# Patient Record
Sex: Female | Born: 1995 | Race: White | Hispanic: No | Marital: Married | Smoking: Current every day smoker
Health system: Southern US, Community
[De-identification: ages and names within clinical notes are randomized; demographics above are authoritative.]

## PROBLEM LIST (undated history)

## (undated) ENCOUNTER — Emergency Department (HOSPITAL_COMMUNITY): Payer: Medicaid Other

## (undated) DIAGNOSIS — F32A Depression, unspecified: Secondary | ICD-10-CM

## (undated) DIAGNOSIS — F419 Anxiety disorder, unspecified: Secondary | ICD-10-CM

## (undated) HISTORY — PX: SPINE SURGERY: SHX786

## (undated) HISTORY — PX: BACK SURGERY: SHX140

## (undated) HISTORY — PX: JOINT REPLACEMENT: SHX530

---

## 2014-04-23 DIAGNOSIS — S069XAA Unspecified intracranial injury with loss of consciousness status unknown, initial encounter: Secondary | ICD-10-CM | POA: Insufficient documentation

## 2014-04-23 DIAGNOSIS — Z87820 Personal history of traumatic brain injury: Secondary | ICD-10-CM | POA: Insufficient documentation

## 2014-04-23 DIAGNOSIS — S069X9A Unspecified intracranial injury with loss of consciousness of unspecified duration, initial encounter: Secondary | ICD-10-CM | POA: Insufficient documentation

## 2020-08-03 ENCOUNTER — Encounter (HOSPITAL_COMMUNITY): Payer: Self-pay | Admitting: Emergency Medicine

## 2020-08-03 ENCOUNTER — Inpatient Hospital Stay (HOSPITAL_COMMUNITY)
Admission: EM | Admit: 2020-08-03 | Discharge: 2020-08-04 | Disposition: A | Discharge status: Home / Self Care | Payer: Medicaid Other | Attending: Obstetrics and Gynecology | Admitting: Obstetrics and Gynecology

## 2020-08-03 ENCOUNTER — Other Ambulatory Visit: Payer: Self-pay

## 2020-08-03 DIAGNOSIS — Z3A01 Less than 8 weeks gestation of pregnancy: Secondary | ICD-10-CM | POA: Insufficient documentation

## 2020-08-03 DIAGNOSIS — F17210 Nicotine dependence, cigarettes, uncomplicated: Secondary | ICD-10-CM | POA: Diagnosis not present

## 2020-08-03 DIAGNOSIS — O209 Hemorrhage in early pregnancy, unspecified: Secondary | ICD-10-CM | POA: Insufficient documentation

## 2020-08-03 DIAGNOSIS — O99331 Smoking (tobacco) complicating pregnancy, first trimester: Secondary | ICD-10-CM | POA: Insufficient documentation

## 2020-08-03 DIAGNOSIS — Z8759 Personal history of other complications of pregnancy, childbirth and the puerperium: Secondary | ICD-10-CM

## 2020-08-03 DIAGNOSIS — O3680X0 Pregnancy with inconclusive fetal viability, not applicable or unspecified: Secondary | ICD-10-CM

## 2020-08-03 HISTORY — DX: Depression, unspecified: F32.A

## 2020-08-03 HISTORY — DX: Anxiety disorder, unspecified: F41.9

## 2020-08-03 NOTE — ED Provider Notes (Signed)
MSE was initiated and I personally evaluated the patient and placed orders (if any) at  11:39 PM on August 03, 2020.  Due for menses last Sunday (LMP 3/9). It didn't start. Had intercourse that night and found blood afterward, without starting a period. Has done 2 home preg tests that are positive. Having light abdominal cramping. No dysuria but feels she is urinating more often. Nausea x 2 weeks. G3, uncomplicated pregnancies.   Today's Vitals   08/03/20 2250  BP: 108/71  Pulse: 90  Resp: 20  Temp: 98.2 F (36.8 C)  TempSrc: Oral  SpO2: 98%   There is no height or weight on file to calculate BMI.  Abdomen nontender  I-stat HCG positive. Discussed with MAU APP, Lelan Pons, who accepts the patient in transfer.   The patient appears stable so that the remainder of the MSE may be completed by another provider.   Charlann Lange, PA-C 08/04/20 0012    Ezequiel Essex, MD 08/04/20 [phone]

## 2020-08-03 NOTE — ED Triage Notes (Signed)
Patient reports vaginal bleeding after sexual encounter with husband this week with mild hypogastric cramping .

## 2020-08-04 ENCOUNTER — Encounter (HOSPITAL_COMMUNITY): Payer: Self-pay

## 2020-08-04 ENCOUNTER — Inpatient Hospital Stay (HOSPITAL_COMMUNITY): Payer: Medicaid Other

## 2020-08-04 DIAGNOSIS — Z8759 Personal history of other complications of pregnancy, childbirth and the puerperium: Secondary | ICD-10-CM

## 2020-08-04 DIAGNOSIS — O209 Hemorrhage in early pregnancy, unspecified: Secondary | ICD-10-CM

## 2020-08-04 DIAGNOSIS — Z3A01 Less than 8 weeks gestation of pregnancy: Secondary | ICD-10-CM

## 2020-08-04 DIAGNOSIS — R519 Headache, unspecified: Secondary | ICD-10-CM | POA: Insufficient documentation

## 2020-08-04 HISTORY — DX: Personal history of other complications of pregnancy, childbirth and the puerperium: Z87.59

## 2020-08-04 LAB — CBC WITH DIFFERENTIAL/PLATELET
Abs Immature Granulocytes: 0.02 10*3/uL (ref 0.00–0.07)
Basophils Absolute: 0 10*3/uL (ref 0.0–0.1)
Basophils Relative: 0 %
Eosinophils Absolute: 0.2 10*3/uL (ref 0.0–0.5)
Eosinophils Relative: 2 %
HCT: 41.6 % (ref 36.0–46.0)
Hemoglobin: 14 g/dL (ref 12.0–15.0)
Immature Granulocytes: 0 %
Lymphocytes Relative: 41 %
Lymphs Abs: 3.5 10*3/uL (ref 0.7–4.0)
MCH: 30.2 pg (ref 26.0–34.0)
MCHC: 33.7 g/dL (ref 30.0–36.0)
MCV: 89.7 fL (ref 80.0–100.0)
Monocytes Absolute: 0.5 10*3/uL (ref 0.1–1.0)
Monocytes Relative: 6 %
Neutro Abs: 4.3 10*3/uL (ref 1.7–7.7)
Neutrophils Relative %: 51 %
Platelets: 289 10*3/uL (ref 150–400)
RBC: 4.64 MIL/uL (ref 3.87–5.11)
RDW: 12.3 % (ref 11.5–15.5)
WBC: 8.5 10*3/uL (ref 4.0–10.5)
nRBC: 0 % (ref 0.0–0.2)

## 2020-08-04 LAB — I-STAT BETA HCG BLOOD, ED (MC, WL, AP ONLY): I-stat hCG, quantitative: 114.7 m[IU]/mL — ABNORMAL HIGH (ref <5)

## 2020-08-04 LAB — WET PREP, GENITAL
Clue Cells Wet Prep HPF POC: NONE SEEN
Sperm: NONE SEEN
Trich, Wet Prep: NONE SEEN
Yeast Wet Prep HPF POC: NONE SEEN

## 2020-08-04 LAB — BASIC METABOLIC PANEL
Anion gap: 5 (ref 5–15)
BUN: 9 mg/dL (ref 6–20)
CO2: 28 mmol/L (ref 22–32)
Calcium: 9.6 mg/dL (ref 8.9–10.3)
Chloride: 103 mmol/L (ref 98–111)
Creatinine, Ser: 0.72 mg/dL (ref 0.44–1.00)
GFR, Estimated: >60 mL/min (ref >60)
Glucose, Bld: 85 mg/dL (ref 70–99)
Potassium: 4.3 mmol/L (ref 3.5–5.1)
Sodium: 136 mmol/L (ref 135–145)

## 2020-08-04 LAB — GC/CHLAMYDIA PROBE AMP (~~LOC~~) NOT AT ARMC
Chlamydia: NEGATIVE
Comment: NEGATIVE
Comment: NORMAL
Neisseria Gonorrhea: NEGATIVE

## 2020-08-04 LAB — URINALYSIS, ROUTINE W REFLEX MICROSCOPIC
Bilirubin Urine: NEGATIVE
Glucose, UA: NEGATIVE mg/dL
Hgb urine dipstick: NEGATIVE
Ketones, ur: NEGATIVE mg/dL
Leukocytes,Ua: NEGATIVE
Nitrite: NEGATIVE
Protein, ur: NEGATIVE mg/dL
Specific Gravity, Urine: 1.013 (ref 1.005–1.030)
pH: 7 (ref 5.0–8.0)

## 2020-08-04 NOTE — Discharge Instructions (Signed)
Vaginal Bleeding During Pregnancy, First Trimester A small amount of bleeding from the vagina, or spotting, is common during early pregnancy. Some bleeding may be related to the pregnancy, and some may not. In many cases, the bleeding is normal and is not a problem. However, bleeding can also be a sign of something serious. Normal things that may cause bleeding during the first trimester:  Implantation of the fertilized egg in the lining of the uterus.  Rapid changes in blood vessels. This is caused by changes that are happening to the body during pregnancy.  Sex.  Pelvic exams. Abnormal things that may cause bleeding during the first trimester include:  Infection or inflammation of the cervix.  Growths or polyps on the cervix.  Miscarriage or threatened miscarriage.  Pregnancy that is growing outside of the uterus (ectopic pregnancy).  A fertilized egg that becomes a mass of tissue (molar pregnancy). Tell your health care provider right away if there is any bleeding from your vagina. Follow these instructions at home: Monitoring your bleeding Monitor your bleeding.  Pay attention to any changes in your symptoms. Let your health care provider know about any concerns.  Try to understand when the bleeding occurs. Does the bleeding start on its own, or does it start after something is done, such as sex or a pelvic exam?  Use a diary to record the things you see about your bleeding, including: ? The kind of bleeding you are having. Does the bleeding start and stop irregularly, or is it a constant flow? ? The severity of your bleeding. Is the bleeding heavy or light? ? The number of pads you use each day, how often you change them, and how soaked they are.  Tell your health care provider if you pass tissue. He or she may want to see it.   Activity  Follow instructions from your health care provider about limiting your activity. Ask what activities are safe for you.  Do not have  sex until your health care provider says that this is safe.  If needed, make plans for someone to help with your regular activities. General instructions  Take over-the-counter and prescription medicines only as told by your health care provider.  Do not take aspirin because it can cause bleeding.  Do not use tampons or douche.  Keep all follow-up visits. This is important. Contact a health care provider if:  You have vaginal bleeding during any part of your pregnancy.  You have cramps or labor pains.  You have a fever or chills. Get help right away if:  You have severe cramps in your back or abdomen.  You pass large clots or a large amount of tissue from your vagina.  Your bleeding increases.  You feel light-headed or weak, or you faint.  You are leaking fluid or have a gush of fluid from your vagina. Summary  A small amount of bleeding from the vagina is common during early pregnancy.  Be sure to tell your health care provider about any vaginal bleeding right away.  Try to understand when bleeding occurs. Does bleeding occur on its own, or does it occur after something is done, such as sex or pelvic exams?  Keep all follow-up visits. This is important. This information is not intended to replace advice given to you by your health care provider. Make sure you discuss any questions you have with your health care provider. Document Revised: 12/31/2019 Document Reviewed: 12/31/2019 Elsevier Patient Education  2021 [employer].

## 2020-08-04 NOTE — MAU Note (Signed)
Patient reports her period 5 days late and on Sunday when she had intercourse she saw a lot of bleeding afterwards. Light non-consistent spotting. Some cramping not consistent. On Monday and Tuesday she took a pregnancy test and it was faintly positive. Today she is here concerned for a miscarriage because of the bleeding and faint pregnancy test.  Pain score: 2/10

## 2020-08-04 NOTE — MAU Provider Note (Signed)
Chief Complaint: Vaginal Bleeding   Event Date/Time   First Provider Initiated Contact with Patient 08/04/20 0126        SUBJECTIVE HPI: Paula Burch is a 25 y.o. G4P3003 at [redacted]w[redacted]d by LMP who presents to maternity admissions reporting Lightly positive pregnancy test two days ago, with post coital bleeding and missed menses.  Has had nausea also. She denies vaginal itching/burning, urinary symptoms, h/a, dizziness, or fever/chills.    Vaginal Bleeding The patient's primary symptoms include missed menses, pelvic pain and vaginal bleeding. The patient's pertinent negatives include no genital itching, genital odor or vaginal discharge. Associated symptoms include nausea. Pertinent negatives include no chills, constipation, diarrhea or fever.   RN Note: Due for menses last Sunday (LMP 3/9). It didn't start. Had intercourse that night and found blood afterward, without starting a period. Has done 2 home preg tests that are positive. Having light abdominal cramping. No dysuria but feels she is urinating more often. Nausea x 2 weeks. G3, uncomplicated pregnancies.   Past Medical History:  Diagnosis Date  . Anxiety   . Depression    Past Surgical History:  Procedure Laterality Date  . BACK SURGERY     Social History   Socioeconomic History  . Marital status: Married    Spouse name: Not on file  . Number of children: Not on file  . Years of education: Not on file  . Highest education level: Not on file  Occupational History  . Not on file  Tobacco Use  . Smoking status: Current Every Day Smoker    Packs/day: 1.00    Years: 10.00    Pack years: 10.00    Types: Cigarettes  . Smokeless tobacco: Never Used  Substance and Sexual Activity  . Alcohol use: Never  . Drug use: Never  . Sexual activity: Yes    Birth control/protection: None  Other Topics Concern  . Not on file  Social History Narrative  . Not on file   Social Determinants of Health   Financial Resource Strain:  Not on file  Food Insecurity: Not on file  Transportation Needs: Not on file  Physical Activity: Not on file  Stress: Not on file  Social Connections: Not on file  Intimate Partner Violence: Not on file   No current facility-administered medications on file prior to encounter.   No current outpatient medications on file prior to encounter.   No Known Allergies  I have reviewed patient's Past Medical Hx, Surgical Hx, Family Hx, Social Hx, medications and allergies.   ROS:  Review of Systems  Constitutional: Negative for chills and fever.  Gastrointestinal: Positive for nausea. Negative for constipation and diarrhea.  Genitourinary: Positive for missed menses, pelvic pain and vaginal bleeding. Negative for vaginal discharge.   Review of Systems  Other systems negative   Physical Exam  Physical Exam Patient Vitals for the past 24 hrs:  BP Temp Temp src Pulse Resp SpO2 Height Weight  08/03/20 2347 -- -- -- -- -- -- 5\' 7"  (1.702 m) 65 kg  08/03/20 2250 108/71 98.2 F (36.8 C) Oral 90 20 98 % -- --   Constitutional: Well-developed, well-nourished female in no acute distress.  Cardiovascular: normal rate Respiratory: normal effort GI: Abd soft, non-tender except mildly tender RLQ  No rebound.  MS: Extremities nontender, no edema, normal ROM Neurologic: Alert and oriented x 4.  GU: Neg CVAT.  PELVIC EXAM: Cervix pink, visually closed, without lesion, scant white creamy discharge, vaginal walls and external genitalia normal  LAB RESULTS Results for orders placed or performed during the hospital encounter of 08/03/20 (from the past 24 hour(s))  CBC with Differential     Status: None   Collection Time: 08/03/20 11:41 PM  Result Value Ref Range   WBC 8.5 4.0 - 10.5 K/uL   RBC 4.64 3.87 - 5.11 MIL/uL   Hemoglobin 14.0 12.0 - 15.0 g/dL   HCT 41.6 36.0 - 46.0 %   MCV 89.7 80.0 - 100.0 fL   MCH 30.2 26.0 - 34.0 pg   MCHC 33.7 30.0 - 36.0 g/dL   RDW 12.3 11.5 - 15.5 %    Platelets 289 150 - 400 K/uL   nRBC 0.0 0.0 - 0.2 %   Neutrophils Relative % 51 %   Neutro Abs 4.3 1.7 - 7.7 K/uL   Lymphocytes Relative 41 %   Lymphs Abs 3.5 0.7 - 4.0 K/uL   Monocytes Relative 6 %   Monocytes Absolute 0.5 0.1 - 1.0 K/uL   Eosinophils Relative 2 %   Eosinophils Absolute 0.2 0.0 - 0.5 K/uL   Basophils Relative 0 %   Basophils Absolute 0.0 0.0 - 0.1 K/uL   Immature Granulocytes 0 %   Abs Immature Granulocytes 0.02 0.00 - 0.07 K/uL  Basic metabolic panel     Status: None   Collection Time: 08/03/20 11:41 PM  Result Value Ref Range   Sodium 136 135 - 145 mmol/L   Potassium 4.3 3.5 - 5.1 mmol/L   Chloride 103 98 - 111 mmol/L   CO2 28 22 - 32 mmol/L   Glucose, Bld 85 70 - 99 mg/dL   BUN 9 6 - 20 mg/dL   Creatinine, Ser 0.72 0.44 - 1.00 mg/dL   Calcium 9.6 8.9 - 10.3 mg/dL   GFR, Estimated >60 >60 mL/min   Anion gap 5 5 - 15  Urinalysis, Routine w reflex microscopic     Status: None   Collection Time: 08/03/20 11:41 PM  Result Value Ref Range   Color, Urine YELLOW YELLOW   APPearance CLEAR CLEAR   Specific Gravity, Urine 1.013 1.005 - 1.030   pH 7.0 5.0 - 8.0   Glucose, UA NEGATIVE NEGATIVE mg/dL   Hgb urine dipstick NEGATIVE NEGATIVE   Bilirubin Urine NEGATIVE NEGATIVE   Ketones, ur NEGATIVE NEGATIVE mg/dL   Protein, ur NEGATIVE NEGATIVE mg/dL   Nitrite NEGATIVE NEGATIVE   Leukocytes,Ua NEGATIVE NEGATIVE  I-Stat beta hCG blood, ED     Status: Abnormal   Collection Time: 08/03/20 11:57 PM  Result Value Ref Range   I-stat hCG, quantitative 114.7 (H) <5 mIU/mL   Comment 3              IMAGING US OB Comp Less 14 Wks  Result Date: 08/04/2020 CLINICAL DATA:  Initial evaluation for acute vaginal bleeding, early pregnancy. EXAM: OBSTETRIC <14 WK [place] AND TRANSVAGINAL OB US TECHNIQUE: Both transabdominal and transvaginal ultrasound examinations were performed for complete evaluation of the gestation as well as the maternal uterus, adnexal regions, and pelvic  cul-de-sac. Transvaginal technique was performed to assess early pregnancy. COMPARISON:  None. FINDINGS: Intrauterine gestational sac: Negative. Yolk sac:  Negative. Embryo:  Negative. Cardiac Activity: Negative. Subchorionic hemorrhage:  None visualized. Maternal uterus/adnexae: Ovaries are normal in appearance bilaterally. Small corpus luteal cyst noted within the right ovary. No adnexal mass or free fluid. IMPRESSION: 1. Early pregnancy with no discrete IUP or adnexal mass identified. Finding is consistent with a pregnancy of unknown anatomic location. Differential considerations include IUP  to early to visualize, recent SAB, or possibly occult ectopic pregnancy. Close clinical monitoring with serial beta HCGs and close interval follow-up ultrasound recommended as clinically warranted. 2. No other acute maternal uterine or adnexal abnormality. Electronically Signed   By: Jeannine Boga M.D.   On: 08/04/2020 02:23   US OB Transvaginal  Result Date: 08/04/2020 CLINICAL DATA:  Initial evaluation for acute vaginal bleeding, early pregnancy. EXAM: OBSTETRIC <14 WK [place] AND TRANSVAGINAL OB US TECHNIQUE: Both transabdominal and transvaginal ultrasound examinations were performed for complete evaluation of the gestation as well as the maternal uterus, adnexal regions, and pelvic cul-de-sac. Transvaginal technique was performed to assess early pregnancy. COMPARISON:  None. FINDINGS: Intrauterine gestational sac: Negative. Yolk sac:  Negative. Embryo:  Negative. Cardiac Activity: Negative. Subchorionic hemorrhage:  None visualized. Maternal uterus/adnexae: Ovaries are normal in appearance bilaterally. Small corpus luteal cyst noted within the right ovary. No adnexal mass or free fluid. IMPRESSION: 1. Early pregnancy with no discrete IUP or adnexal mass identified. Finding is consistent with a pregnancy of unknown anatomic location. Differential considerations include IUP to early to visualize, recent SAB, or  possibly occult ectopic pregnancy. Close clinical monitoring with serial beta HCGs and close interval follow-up ultrasound recommended as clinically warranted. 2. No other acute maternal uterine or adnexal abnormality. Electronically Signed   By: Jeannine Boga M.D.   On: 08/04/2020 02:23     MAU Management/MDM: Ordered usual first trimester r/o ectopic labs.  HCG is low   Pelvic exam and cultures done Will check baseline Ultrasound to rule out ectopic.  This bleeding/pain can represent a normal pregnancy with bleeding, spontaneous abortion or even an ectopic which can be life-threatening.  The process as listed above helps to determine which of these is present.  Reviewed at this HCG level we would not expect to see gestation yet.  Cannot rule out SAB or ectopic yet Recommend repeat HCG Friday night or Sat am, then [place] in 7-10 days Ectopic precautions   ASSESSMENT Pregnancy at [redacted]w[redacted]d Pregnancy of unknown location Bleeding in pregnancy  PLAN Discharge home Plan to repeat HCG level in 48 hours in MAU  Will repeat  Ultrasound in about 7-10 days if HCG levels double appropriately  Ectopic precautions  Pt stable at time of discharge. Encouraged to return here if she develops worsening of symptoms, increase in pain, fever, or other concerning symptoms.    Hansel Feinstein CNM, MSN Certified Nurse-Midwife 08/04/2020  1:26 AM

## 2020-08-08 ENCOUNTER — Other Ambulatory Visit: Payer: Self-pay

## 2020-08-08 ENCOUNTER — Inpatient Hospital Stay (HOSPITAL_COMMUNITY)
Admission: AD | Admit: 2020-08-08 | Discharge: 2020-08-08 | Disposition: A | Discharge status: Home / Self Care | Payer: Medicaid Other | Attending: Family Medicine | Admitting: Family Medicine

## 2020-08-08 DIAGNOSIS — O3680X0 Pregnancy with inconclusive fetal viability, not applicable or unspecified: Secondary | ICD-10-CM

## 2020-08-08 LAB — HCG, QUANTITATIVE, PREGNANCY: hCG, Beta Chain, Quant, S: 1206 m[IU]/mL — ABNORMAL HIGH (ref <5)

## 2020-08-08 LAB — ABO/RH: ABO/RH(D): O POS

## 2020-08-08 LAB — HCG, SERUM, QUALITATIVE: Preg, Serum: POSITIVE — AB

## 2020-08-08 NOTE — MAU Provider Note (Signed)
   S Ms. Paula Burch is a 25 y.o. [id number] patient who presents to MAU today with complaint of repeat HCG.   O BP 108/70 (BP Location: Right Arm)   Pulse 88   Temp 98.1 F (36.7 C) (Oral)   Resp 16   Wt 57.6 kg   LMP 06/29/2020   SpO2 100%   BMI 19.88 kg/m  Physical Exam Vitals and nursing note reviewed.  Cardiovascular:     Rate and Rhythm: Normal rate and regular rhythm.  Abdominal:     General: Abdomen is flat.     Palpations: Abdomen is soft.  Psychiatric:        Mood and Affect: Mood normal.        Behavior: Behavior normal.        Thought Content: Thought content normal.        Judgment: Judgment normal.     A Medical screening exam complete Pregnancy with Uncertain viability  P Discharge from MAU in stable condition HCG level increased. Schedule [place] for viability. Warning signs for worsening condition that would warrant emergency follow-up discussed Patient may return to MAU as needed   Truett Mainland, DO 08/08/2020 1:57 PM

## 2020-08-08 NOTE — MAU Note (Signed)
Came in to get levels retested. Was to have come on Saturday, "was working and was tired when she got off of work". No pain or bleeding. Has had some mild cramping, though denies pain.

## 2020-08-08 NOTE — Discharge Instructions (Signed)
Your hormone level increased.  We will schedule an ultrasound to evaluate for viability.

## 2020-08-14 DIAGNOSIS — F17200 Nicotine dependence, unspecified, uncomplicated: Secondary | ICD-10-CM

## 2020-08-14 DIAGNOSIS — O4690 Antepartum hemorrhage, unspecified, unspecified trimester: Secondary | ICD-10-CM | POA: Insufficient documentation

## 2020-08-14 DIAGNOSIS — F1290 Cannabis use, unspecified, uncomplicated: Secondary | ICD-10-CM | POA: Insufficient documentation

## 2020-08-14 DIAGNOSIS — Z9889 Other specified postprocedural states: Secondary | ICD-10-CM | POA: Insufficient documentation

## 2020-08-14 DIAGNOSIS — Z8759 Personal history of other complications of pregnancy, childbirth and the puerperium: Secondary | ICD-10-CM | POA: Insufficient documentation

## 2020-08-14 HISTORY — DX: Cannabis use, unspecified, uncomplicated: F12.90

## 2020-08-14 HISTORY — DX: Antepartum hemorrhage, unspecified, unspecified trimester: O46.90

## 2020-08-14 HISTORY — DX: Nicotine dependence, unspecified, uncomplicated: F17.200

## 2020-08-31 ENCOUNTER — Ambulatory Visit: Admission: RE | Admit: 2020-08-31 | Payer: Medicaid Other | Source: Ambulatory Visit

## 2020-09-05 DIAGNOSIS — O09291 Supervision of pregnancy with other poor reproductive or obstetric history, first trimester: Secondary | ICD-10-CM | POA: Diagnosis not present

## 2020-09-05 DIAGNOSIS — Z3689 Encounter for other specified antenatal screening: Secondary | ICD-10-CM | POA: Diagnosis not present

## 2020-09-05 DIAGNOSIS — O418X10 Other specified disorders of amniotic fluid and membranes, first trimester, not applicable or unspecified: Secondary | ICD-10-CM | POA: Diagnosis not present

## 2020-09-05 DIAGNOSIS — O99330 Smoking (tobacco) complicating pregnancy, unspecified trimester: Secondary | ICD-10-CM | POA: Insufficient documentation

## 2020-09-05 DIAGNOSIS — O4690 Antepartum hemorrhage, unspecified, unspecified trimester: Secondary | ICD-10-CM | POA: Diagnosis not present

## 2020-09-05 DIAGNOSIS — O468X1 Other antepartum hemorrhage, first trimester: Secondary | ICD-10-CM | POA: Diagnosis not present

## 2020-09-05 DIAGNOSIS — Z3480 Encounter for supervision of other normal pregnancy, unspecified trimester: Secondary | ICD-10-CM | POA: Diagnosis not present

## 2020-09-14 DIAGNOSIS — Z3480 Encounter for supervision of other normal pregnancy, unspecified trimester: Secondary | ICD-10-CM | POA: Diagnosis not present

## 2020-09-20 DIAGNOSIS — Z3480 Encounter for supervision of other normal pregnancy, unspecified trimester: Secondary | ICD-10-CM | POA: Diagnosis not present

## 2020-11-14 DIAGNOSIS — Z3689 Encounter for other specified antenatal screening: Secondary | ICD-10-CM | POA: Diagnosis not present

## 2020-11-14 DIAGNOSIS — Z87898 Personal history of other specified conditions: Secondary | ICD-10-CM | POA: Diagnosis not present

## 2020-11-14 DIAGNOSIS — Z3A18 18 weeks gestation of pregnancy: Secondary | ICD-10-CM | POA: Diagnosis not present

## 2020-11-14 DIAGNOSIS — O0990 Supervision of high risk pregnancy, unspecified, unspecified trimester: Secondary | ICD-10-CM | POA: Insufficient documentation

## 2020-11-14 DIAGNOSIS — Z369 Encounter for antenatal screening, unspecified: Secondary | ICD-10-CM | POA: Diagnosis not present

## 2020-11-14 HISTORY — DX: Supervision of high risk pregnancy, unspecified, unspecified trimester: O09.90

## 2020-11-21 DIAGNOSIS — O09292 Supervision of pregnancy with other poor reproductive or obstetric history, second trimester: Secondary | ICD-10-CM | POA: Diagnosis not present

## 2020-12-05 DIAGNOSIS — O09291 Supervision of pregnancy with other poor reproductive or obstetric history, first trimester: Secondary | ICD-10-CM | POA: Diagnosis not present

## 2020-12-27 DIAGNOSIS — Z3A24 24 weeks gestation of pregnancy: Secondary | ICD-10-CM | POA: Diagnosis not present

## 2020-12-27 DIAGNOSIS — R768 Other specified abnormal immunological findings in serum: Secondary | ICD-10-CM | POA: Diagnosis not present

## 2021-01-26 ENCOUNTER — Ambulatory Visit: Payer: Self-pay

## 2021-01-26 DIAGNOSIS — Z3A29 29 weeks gestation of pregnancy: Secondary | ICD-10-CM | POA: Diagnosis not present

## 2021-01-26 DIAGNOSIS — O09293 Supervision of pregnancy with other poor reproductive or obstetric history, third trimester: Secondary | ICD-10-CM | POA: Diagnosis not present

## 2021-01-26 DIAGNOSIS — F1291 Cannabis use, unspecified, in remission: Secondary | ICD-10-CM | POA: Diagnosis not present

## 2021-03-01 ENCOUNTER — Ambulatory Visit (HOSPITAL_COMMUNITY): Admit: 2021-03-01 | Disposition: A | Discharge status: Home / Self Care | Payer: Medicaid Other

## 2021-03-07 DIAGNOSIS — Z3A34 34 weeks gestation of pregnancy: Secondary | ICD-10-CM | POA: Diagnosis not present

## 2021-03-07 DIAGNOSIS — Z369 Encounter for antenatal screening, unspecified: Secondary | ICD-10-CM | POA: Diagnosis not present

## 2021-03-07 DIAGNOSIS — O0990 Supervision of high risk pregnancy, unspecified, unspecified trimester: Secondary | ICD-10-CM | POA: Diagnosis not present

## 2021-03-07 DIAGNOSIS — R5383 Other fatigue: Secondary | ICD-10-CM | POA: Diagnosis not present

## 2021-03-22 DIAGNOSIS — Z3685 Encounter for antenatal screening for Streptococcus B: Secondary | ICD-10-CM | POA: Diagnosis not present

## 2021-03-22 DIAGNOSIS — Z3A37 37 weeks gestation of pregnancy: Secondary | ICD-10-CM | POA: Diagnosis not present

## 2021-03-22 DIAGNOSIS — O0990 Supervision of high risk pregnancy, unspecified, unspecified trimester: Secondary | ICD-10-CM | POA: Diagnosis not present

## 2021-03-22 DIAGNOSIS — Z8759 Personal history of other complications of pregnancy, childbirth and the puerperium: Secondary | ICD-10-CM | POA: Diagnosis not present

## 2021-03-22 DIAGNOSIS — O0993 Supervision of high risk pregnancy, unspecified, third trimester: Secondary | ICD-10-CM | POA: Diagnosis not present

## 2021-03-28 ENCOUNTER — Other Ambulatory Visit: Payer: Self-pay

## 2021-03-28 ENCOUNTER — Ambulatory Visit
Admission: EM | Admit: 2021-03-28 | Discharge: 2021-03-28 | Disposition: A | Discharge status: Home / Self Care | Payer: Medicaid Other | Attending: Emergency Medicine | Admitting: Emergency Medicine

## 2021-03-28 DIAGNOSIS — Z20828 Contact with and (suspected) exposure to other viral communicable diseases: Secondary | ICD-10-CM

## 2021-03-28 MED ORDER — OSELTAMIVIR PHOSPHATE 75 MG PO CAPS: 75.0000 mg | ORAL_CAPSULE | Freq: Two times a day (BID) | ORAL | 0 refills | Status: AC

## 2021-03-28 MED ORDER — IPRATROPIUM BROMIDE 0.06 % NA SOLN: 2.0000 | Freq: Four times a day (QID) | NASAL | 0 refills | Status: DC

## 2021-03-28 NOTE — Discharge Instructions (Addendum)
Your symptoms are most consistent with a viral upper respiratory illness and I am concerned that the virus is influenza.  The results of your influenza test will be posted to your MyChart, we are required to order COVID test with all flu test at this time in case you are wondering why this was performed.    Based on current influenza treatment guidelines in pregnant patients, it is recommended that you begin antiviral treatment now instead of waiting for the results of your test which could be delayed by as much is 24 to 48 hours.  The complications of influenza infection while you are pregnant far outweigh any of the known minimal risks with Tamiflu treatment.    Please remain home from work, school, public places until you have been fever free for 24 hours and your symptoms have resolved.  As I am sure you are aware, there are very few over-the-counter medications that are still recommended to be taken during pregnancy.    I did provide you with a prescription for a topical nasal decongestant called Atrovent which is not absorbed into the body but only coats the inside of the nose and should reduce your symptoms of nasal congestion and postnasal drip, this may also assist with preventing worsening cough.    Staying well-hydrated, getting plenty of rest and the addition of electrolyte replacement fluids and broth-based soups will also ease your symptoms.  Chloraseptic Throat Spray: This is also minimally absorbed and very effective for sore throat treatment.  Spray 5 sprays into affected area every 2 hours, hold for 15 seconds and either swallow or spit it out.  This is a excellent numbing medication because it is a spray, you can put it right where you needed and so sucking on a lozenge and numbing your entire mouth.  Based on my physical exam findings and the history provided  today, I do not see any evidence of bacterial infection therefore treatment with antibiotics would be of no benefit.  Please  follow-up within the next 3 to 5 days either with your obstetrician or with urgent care if your symptoms do not resolve.

## 2021-03-28 NOTE — ED Provider Notes (Signed)
[facility]    CSN: [id number] Arrival date & time: 03/28/21  1125    HISTORY   Chief Complaint  Patient presents with   Cough   HPI Paula Burch is a 25 y.o. female. Pt presents with a nonproductive cough cough and runny nose, states her 66-year-old son started to feel sick 1 day before she did.  Patient states she is concerned that she has what her son has, is requesting antibiotics.  Patient states he is also had a little bit of a sore throat.  Patient states she is expecting to deliver her baby in about 2 weeks.  The history is provided by the patient.  Past Medical History:  Diagnosis Date   Anxiety    Depression    Patient Active Problem List   Diagnosis Date Noted   Headache 08/04/2020   History of shoulder dystocia in prior pregnancy 08/04/2020   Brain injury 04/23/2014   Past Surgical History:  Procedure Laterality Date   BACK SURGERY     OB History     Gravida  4   Para  3   Term  3   Preterm      AB      Living  3      SAB      IAB      Ectopic      Multiple      Live Births  3          Home Medications    Prior to Admission medications   Medication Sig Start Date End Date Taking? Authorizing Provider  ipratropium (ATROVENT) 0.06 % nasal spray Place 2 sprays into both nostrils 4 (four) times daily. As needed for nasal congestion, runny nose 03/28/21  Yes Lynden Oxford Scales, PA-C  oseltamivir (TAMIFLU) 75 MG capsule Take 1 capsule (75 mg total) by mouth every 12 (twelve) hours for 5 days. 03/28/21 04/02/21 Yes Lynden Oxford Scales, PA-C   Family History History reviewed. No pertinent family history. Social History Social History   Tobacco Use   Smoking status: Every Day    Packs/day: 1.00    Years: 10.00    Pack years: 10.00    Types: Cigarettes   Smokeless tobacco: Never  Substance Use Topics   Alcohol use: Never   Drug use: Never   Allergies   Patient has no known allergies.  Review of  Systems Review of Systems Pertinent findings noted in history of present illness.   Physical Exam Triage Vital Signs ED Triage Vitals  Enc Vitals Group     BP 02/17/21 0827 (!) 147/82     Pulse Rate 02/17/21 0827 72     Resp 02/17/21 0827 18     Temp 02/17/21 0827 98.3 F (36.8 C)     Temp Source 02/17/21 0827 Oral     SpO2 02/17/21 0827 98 %     Weight --      Height --      Head Circumference --      Peak Flow --      Pain Score 02/17/21 0826 5     Pain Loc --      Pain Edu? --      Excl. in [place]? --   No data found.  Updated Vital Signs BP 95/66 (BP Location: Left Arm)   Pulse 70   Temp 98.1 F (36.7 C) (Oral)   Resp 17   LMP 06/29/2020   SpO2 98%   Physical Exam  Constitutional:      Appearance: She is ill-appearing.  HENT:     Head: Normocephalic and atraumatic.     Salivary Glands: Right salivary gland is not diffusely enlarged or tender. Left salivary gland is not diffusely enlarged or tender.     Right Ear: Tympanic membrane, ear canal and external ear normal.     Left Ear: Tympanic membrane, ear canal and external ear normal.     Nose: Congestion and rhinorrhea present. Rhinorrhea is clear.     Right Sinus: No maxillary sinus tenderness or frontal sinus tenderness.     Left Sinus: No maxillary sinus tenderness.     Mouth/Throat:     Mouth: Mucous membranes are moist.     Pharynx: Pharyngeal swelling, posterior oropharyngeal erythema and uvula swelling present.     Tonsils: No tonsillar exudate. 0 on the right. 0 on the left.  Cardiovascular:     Rate and Rhythm: Normal rate and regular rhythm.     Pulses: Normal pulses.  Pulmonary:     Effort: Pulmonary effort is normal. No accessory muscle usage, prolonged expiration or respiratory distress.     Breath sounds: No stridor. No wheezing, rhonchi or rales.     Comments: Turbulent breath sounds throughout without wheeze, rale, rhonchi. Abdominal:     General: Abdomen is flat. Bowel sounds are normal.      Palpations: Abdomen is soft.  Musculoskeletal:        General: Normal range of motion.  Lymphadenopathy:     Cervical: Cervical adenopathy present.     Right cervical: Superficial cervical adenopathy and posterior cervical adenopathy present.     Left cervical: Superficial cervical adenopathy and posterior cervical adenopathy present.  Skin:    General: Skin is warm and dry.  Neurological:     General: No focal deficit present.     Mental Status: She is alert and oriented to person, place, and time.     Motor: Motor function is intact.     Coordination: Coordination is intact.     Gait: Gait is intact.     Deep Tendon Reflexes: Reflexes are normal and symmetric.  Psychiatric:        Attention and Perception: Attention and perception normal.        Mood and Affect: Mood and affect normal.        Speech: Speech normal.        Behavior: Behavior normal. Behavior is cooperative.        Thought Content: Thought content normal.    Visual Acuity Right Eye Distance:   Left Eye Distance:   Bilateral Distance:    Right Eye Near:   Left Eye Near:    Bilateral Near:     UC Couse / Diagnostics / Procedures:    EKG  Radiology No results found.  Procedures Procedures (including critical care time)  UC Diagnoses / Final Clinical Impressions(s)   I have reviewed the triage vital signs and the nursing notes.  Pertinent labs & imaging results that were available during my care of the patient were reviewed by me and considered in my medical decision making (see chart for details).   Final diagnoses:  Exposure to influenza   Based on physical exam findings, possible exposure to influenza and patient's current very pregnant state, will go ahead and treat her empirically with Tamiflu.  Patient advised we will notify her of the results of her flu test once it is available.  Follow-up as needed.  ED  Prescriptions     Medication Sig Dispense Auth. Provider   oseltamivir (TAMIFLU) 75 MG  capsule Take 1 capsule (75 mg total) by mouth every 12 (twelve) hours for 5 days. 10 capsule Lynden Oxford Scales, PA-C   ipratropium (ATROVENT) 0.06 % nasal spray Place 2 sprays into both nostrils 4 (four) times daily. As needed for nasal congestion, runny nose 15 mL Lynden Oxford Scales, PA-C      PDMP not reviewed this encounter.  Pending results:  Labs Reviewed  COVID-19, FLU A+B NAA    Medications Ordered in UC: Medications - No data to display  Disposition Upon Discharge:  Condition: stable for discharge home Home: take medications as prescribed; routine discharge instructions as discussed; follow up as advised.  Patient presented with an acute illness with associated systemic symptoms and significant discomfort requiring urgent management. In my opinion, this is a condition that a prudent lay person (someone who possesses an average knowledge of health and medicine) may potentially expect to result in complications if not addressed urgently such as respiratory distress, impairment of bodily function or dysfunction of bodily organs.   Routine symptom specific, illness specific and/or disease specific instructions were discussed with the patient and/or caregiver at length.   As such, the patient has been evaluated and assessed, work-up was performed and treatment was provided in alignment with urgent care protocols and evidence based medicine.  Patient/parent/caregiver has been advised that the patient may require follow up for further testing and treatment if the symptoms continue in spite of treatment, as clinically indicated and appropriate.  The patient was tested for COVID-19, Influenza and/or RSV, then the patient/parent/guardian was advised to isolate at home pending the results of his/her diagnostic coronavirus test and potentially longer if they're positive. I have also advised pt that if his/her COVID-19 test returns positive, it's recommended to self-isolate for at least  10 days after symptoms first appeared AND until fever-free for 24 hours without fever reducer AND other symptoms have improved or resolved. Discussed self-isolation recommendations as well as instructions for household member/close contacts as per the [facility] and [place] DHHS, and also gave patient the [place] packet with this information.  Patient/parent/caregiver has been advised to return to the [facility] or PCP in 3-5 days if no better; to PCP or the Emergency Department if new signs and symptoms develop, or if the current signs or symptoms continue to change or worsen for further workup, evaluation and treatment as clinically indicated and appropriate  The patient will follow up with their current PCP if and as advised. If the patient does not currently have a PCP we will assist them in obtaining one.   The patient may need specialty follow up if the symptoms continue, in spite of conservative treatment and management, for further workup, evaluation, consultation and treatment as clinically indicated and appropriate.  Patient/parent/caregiver verbalized understanding and agreement of plan as discussed.  All questions were addressed during visit.  Please see discharge instructions below for further details of plan.  Discharge Instructions:   Discharge Instructions      Your symptoms are most consistent with a viral upper respiratory illness and I am concerned that the virus is influenza.  The results of your influenza test will be posted to your MyChart, we are required to order COVID test with all flu test at this time in case you are wondering why this was performed.    Based on current influenza treatment guidelines in pregnant patients, it is recommended that you  begin antiviral treatment now instead of waiting for the results of your test which could be delayed by as much is 24 to 48 hours.  The complications of influenza infection while you are pregnant far outweigh any of the known minimal risks with  Tamiflu treatment.    Please remain home from work, school, public places until you have been fever free for 24 hours and your symptoms have resolved.  As I am sure you are aware, there are very few over-the-counter medications that are still recommended to be taken during pregnancy.    I did provide you with a prescription for a topical nasal decongestant called Atrovent which is not absorbed into the body but only coats the inside of the nose and should reduce your symptoms of nasal congestion and postnasal drip, this may also assist with preventing worsening cough.    Staying well-hydrated, getting plenty of rest and the addition of electrolyte replacement fluids and broth-based soups will also ease your symptoms.  Chloraseptic Throat Spray: This is also minimally absorbed and very effective for sore throat treatment.  Spray 5 sprays into affected area every 2 hours, hold for 15 seconds and either swallow or spit it out.  This is a excellent numbing medication because it is a spray, you can put it right where you needed and so sucking on a lozenge and numbing your entire mouth.  Based on my physical exam findings and the history provided  today, I do not see any evidence of bacterial infection therefore treatment with antibiotics would be of no benefit.  Please follow-up within the next 3 to 5 days either with your obstetrician or with urgent care if your symptoms do not resolve.          Lynden Oxford Scales, PA-C 03/28/21 1624

## 2021-03-28 NOTE — ED Triage Notes (Signed)
Pt presents with a cough and runny nose.

## 2021-03-30 LAB — COVID-19, FLU A+B NAA
Influenza A, NAA: NOT DETECTED
Influenza B, NAA: NOT DETECTED
SARS-CoV-2, NAA: NOT DETECTED

## 2021-04-07 DIAGNOSIS — O471 False labor at or after 37 completed weeks of gestation: Secondary | ICD-10-CM | POA: Diagnosis not present

## 2021-04-07 DIAGNOSIS — Z3A39 39 weeks gestation of pregnancy: Secondary | ICD-10-CM | POA: Diagnosis not present

## 2021-04-11 DIAGNOSIS — O4202 Full-term premature rupture of membranes, onset of labor within 24 hours of rupture: Secondary | ICD-10-CM

## 2021-04-11 DIAGNOSIS — Z3A39 39 weeks gestation of pregnancy: Secondary | ICD-10-CM | POA: Diagnosis not present

## 2021-04-11 DIAGNOSIS — O0990 Supervision of high risk pregnancy, unspecified, unspecified trimester: Secondary | ICD-10-CM | POA: Diagnosis not present

## 2021-04-11 DIAGNOSIS — O368130 Decreased fetal movements, third trimester, not applicable or unspecified: Secondary | ICD-10-CM | POA: Diagnosis not present

## 2021-04-11 HISTORY — DX: Full-term premature rupture of membranes, onset of labor within 24 hours of rupture: O42.02

## 2021-04-26 ENCOUNTER — Emergency Department (HOSPITAL_COMMUNITY)
Admission: EM | Admit: 2021-04-26 | Discharge: 2021-04-26 | Discharge status: Against Medical Advice | Payer: Medicaid Other

## 2021-04-26 DIAGNOSIS — K029 Dental caries, unspecified: Secondary | ICD-10-CM | POA: Diagnosis not present

## 2021-04-26 NOTE — ED Notes (Signed)
No response x3 

## 2021-05-14 DIAGNOSIS — K047 Periapical abscess without sinus: Secondary | ICD-10-CM | POA: Diagnosis not present

## 2021-05-16 ENCOUNTER — Other Ambulatory Visit: Payer: Self-pay

## 2021-05-16 ENCOUNTER — Ambulatory Visit (INDEPENDENT_AMBULATORY_CARE_PROVIDER_SITE_OTHER): Payer: Medicaid Other | Admitting: Podiatry

## 2021-05-16 ENCOUNTER — Ambulatory Visit: Payer: Medicaid Other

## 2021-05-16 ENCOUNTER — Encounter: Payer: Self-pay | Admitting: Podiatry

## 2021-05-16 DIAGNOSIS — D2372 Other benign neoplasm of skin of left lower limb, including hip: Secondary | ICD-10-CM | POA: Diagnosis not present

## 2021-05-16 DIAGNOSIS — M2010 Hallux valgus (acquired), unspecified foot: Secondary | ICD-10-CM

## 2021-05-16 DIAGNOSIS — D2371 Other benign neoplasm of skin of right lower limb, including hip: Secondary | ICD-10-CM

## 2021-05-16 NOTE — Progress Notes (Signed)
°  Subjective:  Patient ID: Paula Burch, female    DOB: April 02, 1996,  MRN: [id number] HPI Chief Complaint  Patient presents with   Foot Pain    Plantar forefoot bilateral - small, callused areas x several months, tried soaking in epsom salts   New Patient (Initial Visit)    26 y.o. female presents with the above complaint.   ROS: Denies fever chills nausea vomiting muscle aches pains calf pain back pain chest pain shortness of breath.  Past Medical History:  Diagnosis Date   Anxiety    Depression    Past Surgical History:  Procedure Laterality Date   BACK SURGERY      Current Outpatient Medications:    ferrous sulfate 325 (65 FE) MG tablet, Take 325 mg by mouth daily., Disp: , Rfl:    ibuprofen (ADVIL) 800 MG tablet, Take 800 mg by mouth every 6 (six) hours as needed., Disp: , Rfl:   No Known Allergies Review of Systems Objective:  There were no vitals filed for this visit.  General: Well developed, nourished, in no acute distress, alert and oriented x3   Dermatological: Skin is warm, dry and supple bilateral. Nails x 10 are well maintained; remaining integument appears unremarkable at this time. There are no open sores, no preulcerative lesions, no rash or signs of infection present.  Multiple benign skin lesions plantar aspect of the forefoot right most painful.  Vascular: Dorsalis Pedis artery and Posterior Tibial artery pedal pulses are 2/4 bilateral with immedate capillary fill time. Pedal hair growth present. No varicosities and no lower extremity edema present bilateral.   Neruologic: Grossly intact via light touch bilateral. Vibratory intact via tuning fork bilateral. Protective threshold with Semmes Wienstein monofilament intact to all pedal sites bilateral. Patellar and Achilles deep tendon reflexes 2+ bilateral. No Babinski or clonus noted bilateral.   Musculoskeletal: No gross boney pedal deformities bilateral. No pain, crepitus, or limitation noted with  foot and ankle range of motion bilateral. Muscular strength 5/5 in all groups tested bilateral.  Gait: Unassisted, Nonantalgic.    Radiographs:  None taken  Assessment & Plan:   Assessment: Benign skin lesions forefoot bilateral.  Plan: Debridement of all benign skin lesions.  Follow-up with her as needed     Kayton Ripp T. [place], [place]

## 2021-05-30 DIAGNOSIS — O9081 Anemia of the puerperium: Secondary | ICD-10-CM | POA: Diagnosis not present

## 2021-05-30 DIAGNOSIS — R87611 Atypical squamous cells cannot exclude high grade squamous intraepithelial lesion on cytologic smear of cervix (ASC-H): Secondary | ICD-10-CM | POA: Diagnosis not present

## 2021-05-30 DIAGNOSIS — Z124 Encounter for screening for malignant neoplasm of cervix: Secondary | ICD-10-CM | POA: Diagnosis not present

## 2021-05-30 DIAGNOSIS — N898 Other specified noninflammatory disorders of vagina: Secondary | ICD-10-CM | POA: Diagnosis not present

## 2021-06-03 DIAGNOSIS — R87611 Atypical squamous cells cannot exclude high grade squamous intraepithelial lesion on cytologic smear of cervix (ASC-H): Secondary | ICD-10-CM | POA: Insufficient documentation

## 2021-06-04 ENCOUNTER — Emergency Department (HOSPITAL_BASED_OUTPATIENT_CLINIC_OR_DEPARTMENT_OTHER)
Admission: EM | Admit: 2021-06-04 | Discharge: 2021-06-04 | Disposition: A | Discharge status: Home / Self Care | Payer: Medicaid Other | Attending: Emergency Medicine | Admitting: Emergency Medicine

## 2021-06-04 ENCOUNTER — Encounter (HOSPITAL_BASED_OUTPATIENT_CLINIC_OR_DEPARTMENT_OTHER): Payer: Self-pay

## 2021-06-04 ENCOUNTER — Other Ambulatory Visit: Payer: Self-pay

## 2021-06-04 DIAGNOSIS — K0889 Other specified disorders of teeth and supporting structures: Secondary | ICD-10-CM | POA: Diagnosis not present

## 2021-06-04 DIAGNOSIS — K029 Dental caries, unspecified: Secondary | ICD-10-CM | POA: Diagnosis not present

## 2021-06-04 MED ORDER — AMOXICILLIN-POT CLAVULANATE 875-125 MG PO TABS: 1.0000 | ORAL_TABLET | Freq: Two times a day (BID) | ORAL | 0 refills | Status: DC

## 2021-06-04 MED ORDER — HYDROCODONE-ACETAMINOPHEN 5-325 MG PO TABS: 1.0000 | ORAL_TABLET | Freq: Four times a day (QID) | ORAL | 0 refills | Status: DC | PRN

## 2021-06-04 NOTE — ED Triage Notes (Signed)
Patient here POV from Home with Dental Pain.  Patient was eating a Bagel on Friday when her Tooth "broke". Left Molar.   No Fevers.   NAD noted during Triage. A&Ox4. GCS 15. Ambulatory.

## 2021-06-04 NOTE — ED Notes (Signed)
Discharge instructions including follow up care, prescription, and pain management discussed with pt. Pt verbalized understanding with no questions at this time.

## 2021-06-04 NOTE — Discharge Instructions (Addendum)
Please use Tylenol or ibuprofen for pain.  You may use 600 mg ibuprofen every 6 hours or 1000 mg of Tylenol every 6 hours.  You may choose to alternate between the 2.  This would be most effective.  Not to exceed 4 g of Tylenol within 24 hours.  Not to exceed 3200 mg ibuprofen 24 hours.  You can use the Norco in place of Tylenol for breakthrough pain.  Discontinue swishing hydrogen peroxide around your mouth.  I recommend that you try to find some Orajel at the pharmacy to help with pain.  Stick to some soft foods.  Follow-up with a dentist as you have planned.

## 2021-06-04 NOTE — ED Provider Notes (Signed)
[facility] DEPT Provider Note   CSN: [id number] Arrival date & time: 06/04/21  1933     History  Chief Complaint  Patient presents with   Dental Pain    Paula Burch is a 26 y.o. female with a past medical history significant for known dental cavities who presents with concern for broken tooth, significant tooth pain on the left side.  Patient reports that she bit into a bagel on Friday and broke part of her tooth off.  Patient denies swallowing the tooth.  Patient denies any fever, chills.  Patient reports that she has been using ibuprofen, as well as swishing hydrogen peroxide in her mouth with minimal pain control.  Patient reports her pain is 8/10 at this time.  Patient denies any sour taste in her mouth, difficulty swallowing, difficulty breathing.   Dental Pain     Home Medications Prior to Admission medications   Medication Sig Start Date End Date Taking? Authorizing Provider  amoxicillin-clavulanate (AUGMENTIN) 875-125 MG tablet Take 1 tablet by mouth every 12 (twelve) hours. 06/04/21  Yes Kalynne Womac H, PA-C  HYDROcodone-acetaminophen (NORCO/VICODIN) 5-325 MG tablet Take 1-2 tablets by mouth every 6 (six) hours as needed. 06/04/21  Yes Hillarie Harrigan H, PA-C  ferrous sulfate 325 (65 FE) MG tablet Take 325 mg by mouth daily. 04/17/21   [provider]  ibuprofen (ADVIL) 800 MG tablet Take 800 mg by mouth every 6 (six) hours as needed. 05/14/21   [provider]      Allergies    Patient has no known allergies.    Review of Systems   Review of Systems  HENT:  Positive for dental problem.   All other systems reviewed and are negative.  Physical Exam Updated Vital Signs BP 119/77 (BP Location: Right Arm)    Pulse 70    Temp 97.8 F (36.6 C) (Oral)    Resp 16    Ht 5\' 7"  (1.702 m)    Wt 63.5 kg    LMP 06/29/2020    SpO2 100%    Breastfeeding Unknown    BMI 21.93 kg/m  Physical Exam Vitals and nursing note  reviewed.  Constitutional:      General: She is not in acute distress.    Appearance: Normal appearance. She is not ill-appearing.  HENT:     Head: Normocephalic and atraumatic.     Mouth/Throat:      Comments: Posterior oropharynx clear, tonsils 1+, no peritonsillar abscess noted.  Patient does have poor dentition noted bilaterally, she has a cavity with broken tooth noted in the diagram above.  There is some redness and irritation of the gum, no evidence of a abscess, floor of mouth swelling, fluctuance, or purulent drainage. Eyes:     General:        Right eye: No discharge.        Left eye: No discharge.  Cardiovascular:     Rate and Rhythm: Normal rate and regular rhythm.  Pulmonary:     Effort: Pulmonary effort is normal. No respiratory distress.  Musculoskeletal:        General: No deformity.  Skin:    General: Skin is warm and dry.  Neurological:     Mental Status: She is alert and oriented to person, place, and time.  Psychiatric:        Mood and Affect: Mood normal.        Behavior: Behavior normal.    ED Results / Procedures / Treatments  Labs (all labs ordered are listed, but only abnormal results are displayed) Labs Reviewed - No data to display  EKG None  Radiology No results found.  Procedures Procedures    Medications Ordered in ED Medications - No data to display  ED Course/ Medical Decision Making/ A&P                           Medical Decision Making  This is an overall well-appearing 26 year old female who presents with dental pain, known caries, and new broken tooth on the left.  As noted in the diagram in physical exam this is one of her premolars.  There is some redness without fluctuance, or area of purulent drainage.  Discussed with patient that she needs to follow-up with dentist, she reports that she already has a appointment scheduled.  I do not see any evidence of abscess, peritonsillar abscess, Ludwig angina.  Discussed we will cover  for infection as there is some surrounding redness, and known bacteria and dental caries, as well as provide her some pain medication to make it to the dentist.  Patient discharged in stable condition at this time, extensive return precautions given. Final Clinical Impression(s) / ED Diagnoses Final diagnoses:  Pain due to dental caries    Rx / DC Orders ED Discharge Orders          Ordered    HYDROcodone-acetaminophen (NORCO/VICODIN) 5-325 MG tablet  Every 6 hours PRN        06/04/21 2009    amoxicillin-clavulanate (AUGMENTIN) 875-125 MG tablet  Every 12 hours        06/04/21 2009              Dorien Chihuahua 06/04/21 2031    Lajean Saver, MD 06/06/21 1537

## 2021-06-05 ENCOUNTER — Telehealth: Payer: Self-pay

## 2021-06-05 NOTE — Telephone Encounter (Signed)
Transition Care Management Unsuccessful Follow-up Telephone Call  Date of discharge and from where:  06/04/2021-[facility]  Attempts:  1st Attempt  Reason for unsuccessful TCM follow-up call:  Left voice message

## 2021-06-06 NOTE — Telephone Encounter (Signed)
Patient is seen by Karrie Meres.

## 2021-06-07 ENCOUNTER — Telehealth: Payer: Self-pay

## 2021-06-07 NOTE — Patient Outreach (Signed)
Care Coordination  06/07/2021  Paula Burch 12/03/1995 [id number]   Medicaid Managed Care   Unsuccessful Outreach Note  06/07/2021 Name: Paula Burch MRN: [id number] DOB: 21-Sep-1995  Referred by: Pcp, No Reason for referral : High Risk Managed Medicaid (MM Social Work Unsuccessful [employer])   An unsuccessful telephone outreach was attempted today. The patient was referred to the case management team for assistance with care management and care coordination.   Follow Up Plan: The care management team will reach out to the patient again over the next 7 days.   Mickel Fuchs, BSW, [place] Managed Medicaid Team  [phone]

## 2021-06-07 NOTE — Patient Instructions (Signed)
Visit Information  Paula Burch  - as a part of your Medicaid benefit, you are eligible for care management and care coordination services at no cost or copay. I was unable to reach you by phone today but would be happy to help you with your health related needs. Please feel free to call me @ [phone]  A member of the Managed Medicaid care management team will reach out to you again over the next 7 days.   Mickel Fuchs, BSW, [place] Managed Medicaid Team  [phone]

## 2021-06-16 ENCOUNTER — Telehealth: Payer: Self-pay

## 2021-06-16 NOTE — Patient Outreach (Signed)
Care Coordination  06/16/2021  Paula Burch [birth year] [id number]   Medicaid Managed Care   Unsuccessful Outreach Note  06/16/2021 Name: Paula Burch MRN: [id number] DOB: 10/20/1995  Referred by: Pcp, No Reason for referral : High Risk Managed Medicaid (MM Social Work [employer])   An unsuccessful telephone outreach was attempted today. The patient was referred to the case management team for assistance with care management and care coordination.   Follow Up Plan: The patient has been provided with contact information for the care management team and has been advised to call with any health related questions or concerns.   Mickel Fuchs, BSW, [place] Managed Medicaid Team  [phone]

## 2021-06-16 NOTE — Patient Instructions (Signed)
Visit Information  Paula Burch  - as a part of your Medicaid benefit, you are eligible for care management and care coordination services at no cost or copay. I was unable to reach you by phone today but would be happy to help you with your health related needs. Please feel free to call me @ [place], BSW, [place] Medicaid Team  [phone]

## 2021-07-03 DIAGNOSIS — Z01812 Encounter for preprocedural laboratory examination: Secondary | ICD-10-CM | POA: Diagnosis not present

## 2021-07-03 DIAGNOSIS — R87611 Atypical squamous cells cannot exclude high grade squamous intraepithelial lesion on cytologic smear of cervix (ASC-H): Secondary | ICD-10-CM | POA: Diagnosis not present

## 2021-07-03 DIAGNOSIS — N72 Inflammatory disease of cervix uteri: Secondary | ICD-10-CM | POA: Diagnosis not present

## 2021-08-03 ENCOUNTER — Encounter: Payer: Self-pay | Admitting: Podiatry

## 2021-08-03 ENCOUNTER — Ambulatory Visit (INDEPENDENT_AMBULATORY_CARE_PROVIDER_SITE_OTHER): Payer: Medicaid Other | Admitting: Podiatry

## 2021-08-03 DIAGNOSIS — D492 Neoplasm of unspecified behavior of bone, soft tissue, and skin: Secondary | ICD-10-CM | POA: Diagnosis not present

## 2021-08-03 DIAGNOSIS — B070 Plantar wart: Secondary | ICD-10-CM | POA: Diagnosis not present

## 2021-08-03 DIAGNOSIS — M2011 Hallux valgus (acquired), right foot: Secondary | ICD-10-CM

## 2021-08-03 NOTE — Patient Instructions (Signed)
Wart Surgery-Directions for Home Care  You will need: Dial antibacterial hand soap,  gauze,  Band-aids  Keep the original bandage on until the following morning.  Bathe or shower with the bandage on allowing it to soak, so that when removed it won't stick to the wound. After showering or bathing, remove the old bandage and cleanse the area with Dial soap and water.  Place a few drops of Dial soap and water on a piece of guaze and gently scrub the area.  Dry with a clean piece of gauze. Apply antibiotic cream (polysporin, triple antibiotic or similar) to the area and place a clean square gauze bandage over and cover with a band-aid. In the evening, add a few drops of Dial soap to a basin of lukewarm water and soak your foot for 15 minutes.  After soaking, follow the instructions above for cleaning the area. Continue cleansing the area as described above two times a day, applying sterile gauze dressings until the doctor informs you that it is not needed. The time required to heal the surgical site will depend upon the size and location of the wart.  Lesions under bony prominences heal slower.  The average healing time is 2 to 4 weeks. Take over the counter Ibuprofen or Tylenol as needed should you experience any discomfort If you do experience discomfort after surgery, keep the foot elevated and apply an ice pack over your ankle, 30 minutes on, 30 minutes off each hour for the rest of the day. If you have any questions , please do not hesitate to contact the office.  WARTS (Verrucae)  Warts are caused by a virus that has invaded the skin.  They are more common in young adults and children and a small percentage will resolve on their own.  There are many types of warts including mosaic warts (large flat), vulgaris (domed warts-have pearl like appearance), and plantar warts (flat or cauliflower like appearance).  Warts are highly contagious and may be picked up from any surface.  Warts thrive in a warm  moist environment and are common near pools, showers, and locker room floors.  Any microscopic cut in the skin is where the virus enters and becomes a wart.  Warts are very difficult to treat and get rid of.  Patience is necessary in the treatment of this virus.  It may take months to cure and different methods may have to be used to get rid of your wart.  Standard Initial Treatment is: Periodic debridement of the wart and application of Canthacur to each lesion (a blistering agent that will slough off the warty skin) Dispensing of topical treatments/prescriptions to apply to the wart at home  Other options include: Excision of the lesion-numbing the skin around the wart and cutting it out-requires daily soaks post-operatively and takes about 2-3 weeks to fully heal Excision with CO2 Laser-Performed at the surgical center your foot is numbed up and the lesions are all cut out and then lasered with a high power laser.  Very good for multiple warts that are resistant. Cimetidine (Tagamet)-Oral agent used in high does--has shown better results in children  How do I apply the standard topical treatments?  Salicylic Acid (Compound W wart remover liquid or gel-available at drug or grocery stores)-Apply a dime size thickness over the wart and cover with duct tape-apply at night so the medication does not spread out to the good skin.  The skin will turn white and slowly blister off.  Use a   pumice stone daily to remove the white skin as best you can.  If the skin gets too raw and painful, discontinue for a few days then resume. Aldara (Imiquimod)-this is an immune response modifier.  They come in little packets so try to get at least 2 days out of each packet if you can.  Apply a small amount to the lesion and cover with duct tape.  Do not rub it in-let it absorb on its own.  Good to apply each morning.  Other Helpful Hints: Wash shoes that can be washed in the washing machine 2-3 x per month with some  bleach Use Lysol in shoes that cannot be washed and wipe out with a cloth 1 x per week-allow to dry for 8 hours before wearing again Use a bleach solution (1 part bleach to 3 parts water) in your tub or shower to reduce the spread of the virus to yourself and others Use aqua socks or clean sandals when at the pool or locker room to reduce the chance of picking up the virus or spreading it to others  

## 2021-08-06 NOTE — Progress Notes (Signed)
She presents today chief complaint of painful lesion plantar aspect forefoot right. ? ?Objective: Vital signs are stable alert oriented x3 solitary porokeratotic lesion appears to be demonstrating early or primordial verrucoid characteristics.  There is a thrombosed capillaries and skin lines look as if they are starting to circumvent the lesion. ? ?Assessment: Cannot rule out verruca plantaris forefoot right.  Though it does demonstrate porokeratotic characteristics as well. ? ?Plan: Discussed etiology pathology and surgical therapies at this point performed a surgical curettage after local anesthetic was administered she tolerated the procedure well.  Pathology was sent and she received both oral and written home-going instruction for the care and soaking of the foot and I will follow-up with her in 2 weeks.  Should this come back abnormal I will notify her immediately. ?

## 2021-08-08 LAB — PATHOLOGY REPORT

## 2021-08-08 LAB — TISSUE SPECIMEN

## 2021-09-06 DIAGNOSIS — H5213 Myopia, bilateral: Secondary | ICD-10-CM | POA: Diagnosis not present

## 2021-09-19 DIAGNOSIS — H5213 Myopia, bilateral: Secondary | ICD-10-CM | POA: Diagnosis not present

## 2021-09-19 DIAGNOSIS — H52223 Regular astigmatism, bilateral: Secondary | ICD-10-CM | POA: Diagnosis not present

## 2021-11-09 DIAGNOSIS — K047 Periapical abscess without sinus: Secondary | ICD-10-CM | POA: Diagnosis not present

## 2022-01-26 DIAGNOSIS — K047 Periapical abscess without sinus: Secondary | ICD-10-CM | POA: Diagnosis not present

## 2022-04-02 IMAGING — US US OB COMP LESS 14 WK
1 series · 15 of 28 positions shown · non-contrast
Comparison: None.

CLINICAL DATA: Initial evaluation for acute vaginal bleeding, early
pregnancy.

EXAM:
OBSTETRIC <14 WK US AND TRANSVAGINAL OB US
TECHNIQUE: Both transabdominal and transvaginal ultrasound examinations were
performed for complete evaluation of the gestation as well as the
maternal uterus, adnexal regions, and pelvic cul-de-sac.
Transvaginal technique was performed to assess early pregnancy.

[Series 1: us ob comp less 14 wk · 15 of 54 slices shown]
[im 1/54]
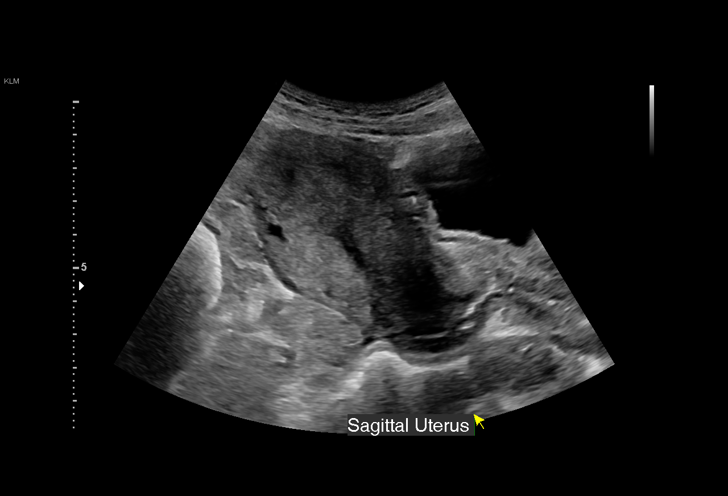
[im 4/54]
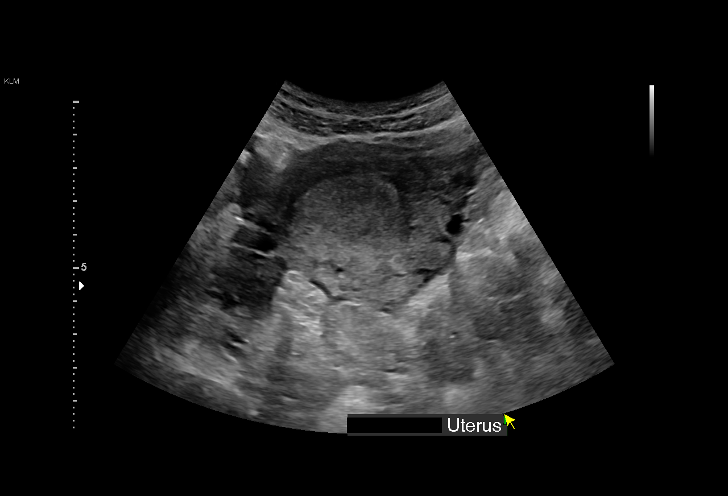
[im 8/54]
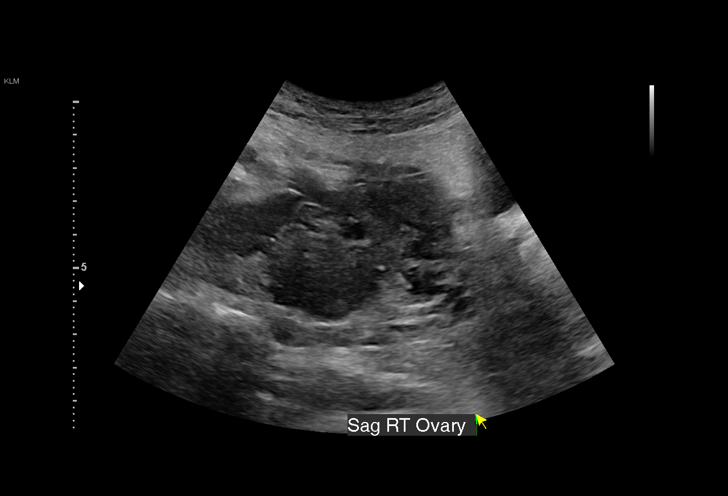
[im 12/54]
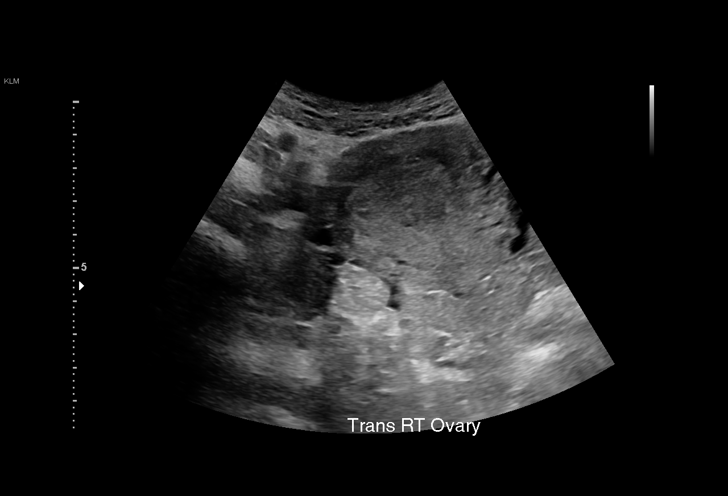
[im 16/54]
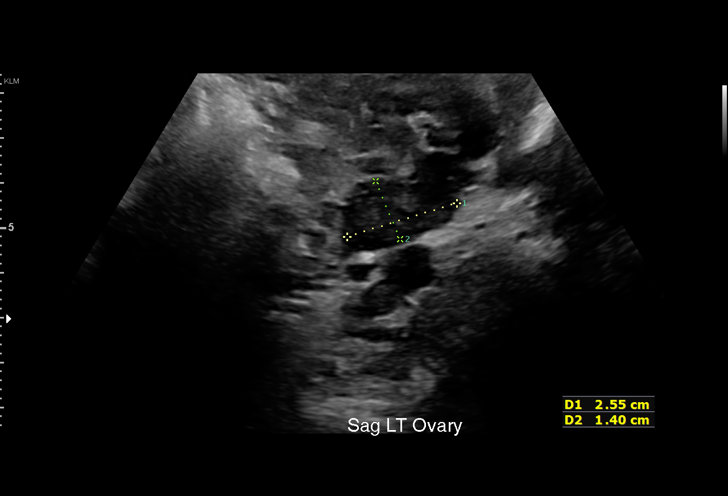
[im 20/54]
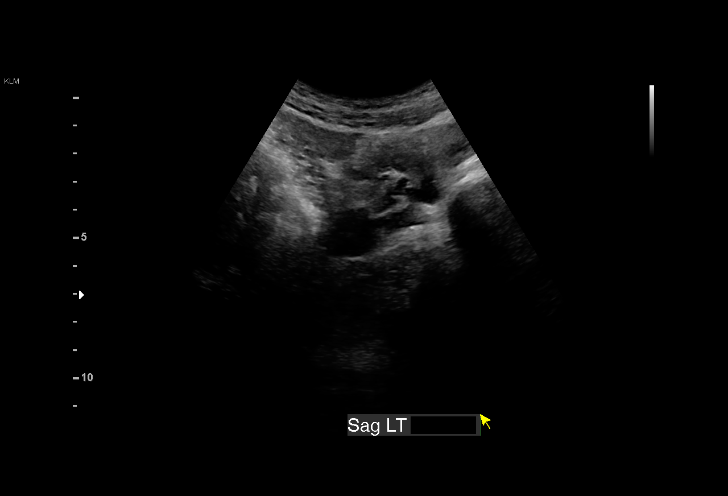
[im 24/54]
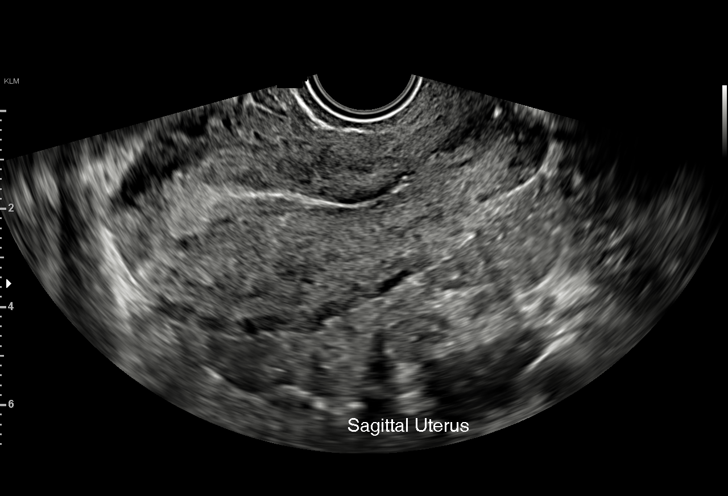
[im 28/54]
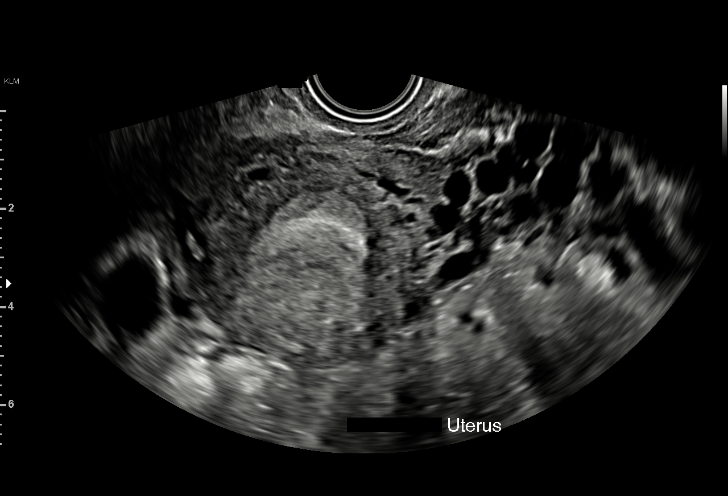
[im 30/54]
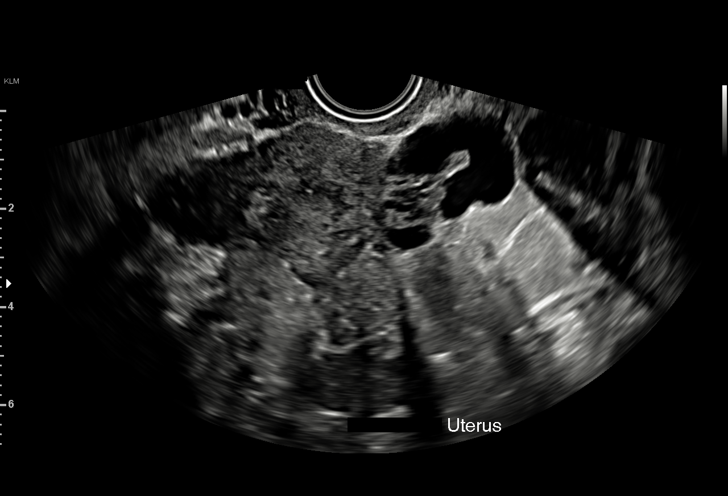
[im 34/54]
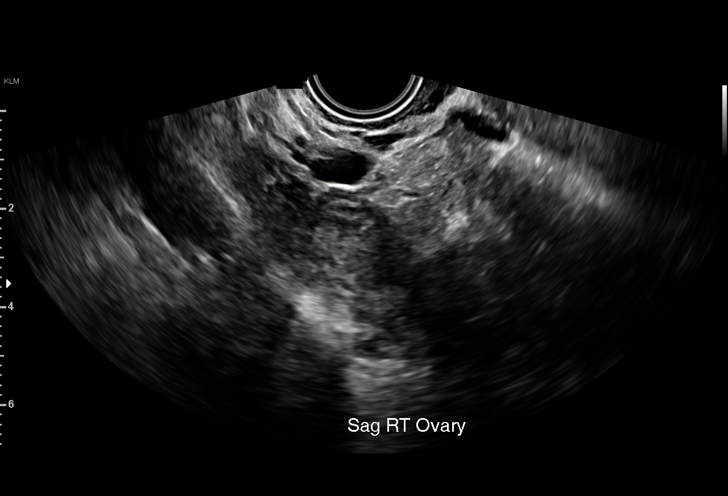
[im 38/54]
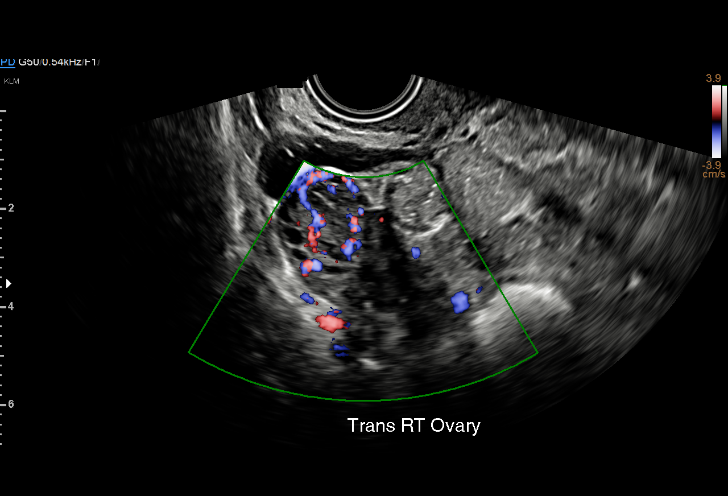
[im 42/54]
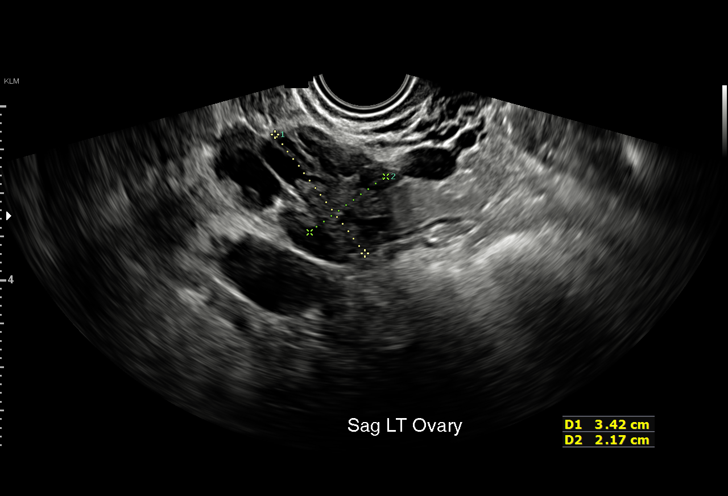
[im 46/54]
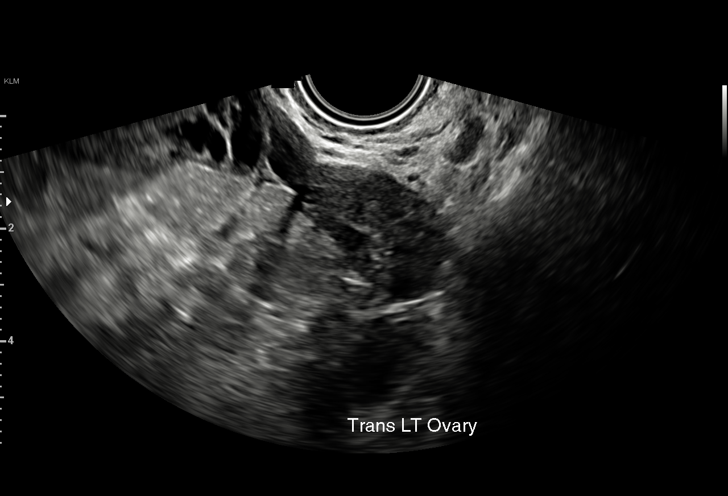
[im 50/54]
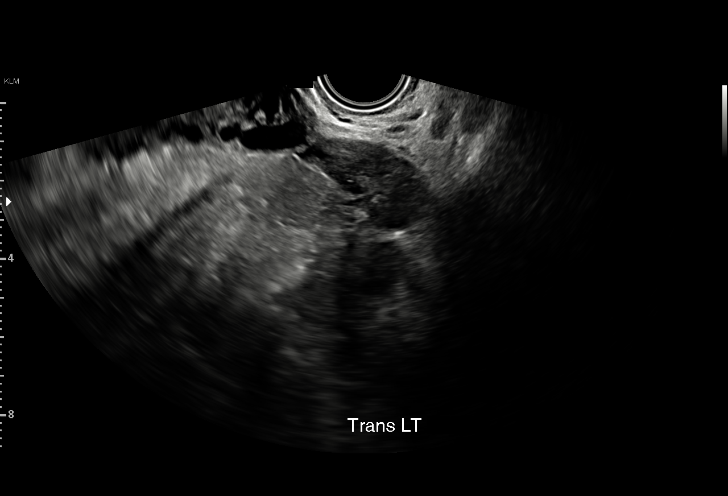
[im 54/54]
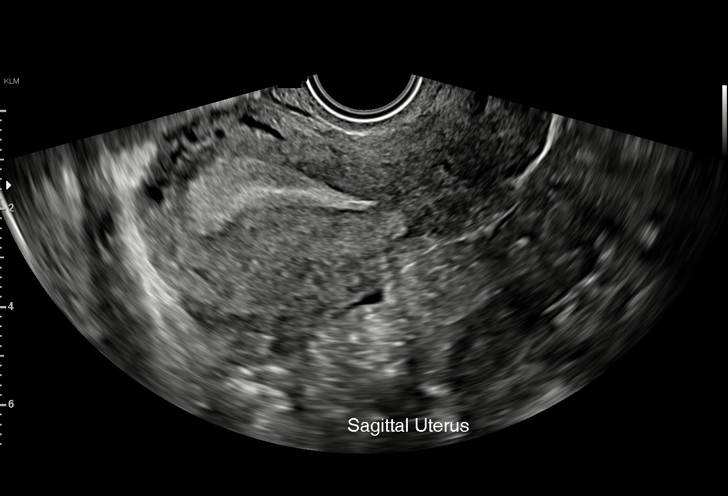

[15 of 28 positions shown; findings below may reference images not displayed]

FINDINGS: Intrauterine gestational sac: Negative.

Yolk sac:  Negative.

Embryo:  Negative.

Cardiac Activity: Negative.

Subchorionic hemorrhage:  None visualized.

Maternal uterus/adnexae: Ovaries are normal in appearance
bilaterally. Small corpus luteal cyst noted within the right ovary.
No adnexal mass or free fluid.
IMPRESSION: 1. Early pregnancy with no discrete IUP or adnexal mass identified.
Finding is consistent with a pregnancy of unknown anatomic location.
Differential considerations include IUP to early to visualize,
recent SAB, or possibly occult ectopic pregnancy. Close clinical
monitoring with serial beta HCGs and close interval follow-up
ultrasound recommended as clinically warranted.
2. No other acute maternal uterine or adnexal abnormality.

## 2022-04-17 ENCOUNTER — Emergency Department (HOSPITAL_BASED_OUTPATIENT_CLINIC_OR_DEPARTMENT_OTHER)
Admission: EM | Admit: 2022-04-17 | Discharge: 2022-04-17 | Discharge status: Against Medical Advice | Payer: Medicaid Other | Attending: Emergency Medicine | Admitting: Emergency Medicine

## 2022-04-17 ENCOUNTER — Other Ambulatory Visit: Payer: Self-pay

## 2022-04-17 ENCOUNTER — Encounter (HOSPITAL_BASED_OUTPATIENT_CLINIC_OR_DEPARTMENT_OTHER): Payer: Self-pay | Admitting: Emergency Medicine

## 2022-04-17 DIAGNOSIS — R220 Localized swelling, mass and lump, head: Secondary | ICD-10-CM | POA: Diagnosis not present

## 2022-04-17 DIAGNOSIS — Z5321 Procedure and treatment not carried out due to patient leaving prior to being seen by health care provider: Secondary | ICD-10-CM | POA: Diagnosis not present

## 2022-04-17 DIAGNOSIS — K0889 Other specified disorders of teeth and supporting structures: Secondary | ICD-10-CM | POA: Diagnosis present

## 2022-04-17 NOTE — ED Triage Notes (Signed)
L upper dental pain since last night with facial swelling. States she was treated for an infection earlier this month and is going to have it pulled.

## 2022-04-24 DIAGNOSIS — J111 Influenza due to unidentified influenza virus with other respiratory manifestations: Secondary | ICD-10-CM | POA: Diagnosis not present

## 2022-04-24 DIAGNOSIS — R110 Nausea: Secondary | ICD-10-CM | POA: Diagnosis not present

## 2022-04-24 DIAGNOSIS — R509 Fever, unspecified: Secondary | ICD-10-CM | POA: Diagnosis not present

## 2022-04-24 DIAGNOSIS — N644 Mastodynia: Secondary | ICD-10-CM | POA: Diagnosis not present

## 2022-06-22 ENCOUNTER — Ambulatory Visit: Payer: Medicaid Other | Admitting: Internal Medicine

## 2022-07-06 ENCOUNTER — Encounter: Payer: Self-pay | Admitting: Internal Medicine

## 2022-07-06 ENCOUNTER — Ambulatory Visit (INDEPENDENT_AMBULATORY_CARE_PROVIDER_SITE_OTHER): Payer: Medicaid Other | Admitting: Internal Medicine

## 2022-07-06 VITALS — BP 88/62 | HR 65 | Temp 97.2°F | Ht 67.0 in | Wt 138.6 lb

## 2022-07-06 DIAGNOSIS — Z9889 Other specified postprocedural states: Secondary | ICD-10-CM | POA: Diagnosis not present

## 2022-07-06 DIAGNOSIS — G44321 Chronic post-traumatic headache, intractable: Secondary | ICD-10-CM

## 2022-07-06 DIAGNOSIS — E559 Vitamin D deficiency, unspecified: Secondary | ICD-10-CM | POA: Diagnosis not present

## 2022-07-06 DIAGNOSIS — H6693 Otitis media, unspecified, bilateral: Secondary | ICD-10-CM | POA: Diagnosis not present

## 2022-07-06 DIAGNOSIS — O09291 Supervision of pregnancy with other poor reproductive or obstetric history, first trimester: Secondary | ICD-10-CM | POA: Diagnosis not present

## 2022-07-06 DIAGNOSIS — Z0000 Encounter for general adult medical examination without abnormal findings: Secondary | ICD-10-CM | POA: Diagnosis not present

## 2022-07-06 DIAGNOSIS — Z87820 Personal history of traumatic brain injury: Secondary | ICD-10-CM | POA: Diagnosis not present

## 2022-07-06 DIAGNOSIS — F4310 Post-traumatic stress disorder, unspecified: Secondary | ICD-10-CM | POA: Insufficient documentation

## 2022-07-06 DIAGNOSIS — R768 Other specified abnormal immunological findings in serum: Secondary | ICD-10-CM

## 2022-07-06 DIAGNOSIS — R5383 Other fatigue: Secondary | ICD-10-CM

## 2022-07-06 DIAGNOSIS — I959 Hypotension, unspecified: Secondary | ICD-10-CM | POA: Diagnosis not present

## 2022-07-06 DIAGNOSIS — F99 Mental disorder, not otherwise specified: Secondary | ICD-10-CM

## 2022-07-06 DIAGNOSIS — R454 Irritability and anger: Secondary | ICD-10-CM | POA: Diagnosis not present

## 2022-07-06 DIAGNOSIS — R5382 Chronic fatigue, unspecified: Secondary | ICD-10-CM | POA: Diagnosis not present

## 2022-07-06 DIAGNOSIS — Z8759 Personal history of other complications of pregnancy, childbirth and the puerperium: Secondary | ICD-10-CM | POA: Diagnosis not present

## 2022-07-06 DIAGNOSIS — Z119 Encounter for screening for infectious and parasitic diseases, unspecified: Secondary | ICD-10-CM

## 2022-07-06 HISTORY — DX: Irritability and anger: R45.4

## 2022-07-06 LAB — POCT URINE PREGNANCY: Preg Test, Ur: NEGATIVE

## 2022-07-06 LAB — POCT URINALYSIS DIPSTICK
Bilirubin, UA: NEGATIVE
Blood, UA: NEGATIVE
Glucose, UA: NEGATIVE
Ketones, UA: NEGATIVE
Leukocytes, UA: NEGATIVE
Nitrite, UA: NEGATIVE
Protein, UA: NEGATIVE
Spec Grav, UA: 1.01 (ref 1.010–1.025)
Urobilinogen, UA: 0.2 E.U./dL
pH, UA: 7.5 (ref 5.0–8.0)

## 2022-07-06 MED ORDER — AMOXICILLIN 500 MG PO CAPS
500.0000 mg | ORAL_CAPSULE | Freq: Two times a day (BID) | ORAL | 0 refills | Status: AC
Start: 2022-07-06 — End: 2022-07-16

## 2022-07-06 MED ORDER — FLUTICASONE PROPIONATE 50 MCG/ACT NA SUSP: 2.0000 | Freq: Every day | NASAL | 6 refills | Status: DC

## 2022-07-06 MED ORDER — SIMPLY SALINE 0.9 % NA AERS: 2.0000 | INHALATION_SPRAY | NASAL | 11 refills | Status: DC

## 2022-07-06 NOTE — Patient Instructions (Addendum)
[facility]. Address: [address], [place], [place], [address] Phone: [phone] Services: Individual Counseling, Family Therapy, Couples Counseling, Trauma Therapy, Stress Management, Depression Treatment, Anxiety Treatment, and Grief Counseling   Madalyn Rob, [facility] Address: [place], [place], [place], [address] Phone: [phone] Specializes in EMDR, PTSD, anxiety, cognitive behavioral therapy, and other disorders   [facility] Address: [place], [place], [place], [address] Phone: [phone] Offers professional counseling specializing in Complex Trauma and Dissociative Disorders, mood disorders, personality disorders, and EMDR 3.  [facility] Address: [address] [place], [place], [address] Phone: [phone] Specializes in trauma, addiction, and perinatal therapy services 4.  [facility] [address] [place],  [place] [address] [phone]   This shows  how the eustachian canal works to drain the inner ear and how it is connected to the nasopharynx.  Nasal sinus rinses with saline nasal mist sprays can rinse all of the pollen and allergens and other irritants and pathogens out of his sinuses and nasopharynx, reducing the plugging/swelling around the eustachian tube.  The sinus rinse clears away the mucus and allows nasal steroid sprays to help reach the eustachian tube and reduce swellling to open it up.    Basic Sinus Care: Mist each nostril nightly with sterile saline nasal mist, then spray each nostril immediately after with fluticasone nasal spray (Flonase) or other steroid or antihistamine nasal pray Next, if symptoms persist, add a daily allergy pill.  Benadryl is the strongest but will make you very drowsy so only take when you can sleep after.  Ear Pressure Relief Maneuvers: Step 1 If the congestion is mild, you can often use simple  maneuvers to quickly alter the pressure in your middle ear, such as: Swallowing Yawning Chewing gum Sucking on hard candy Similar methods can be used on children. If traveling with an infant or toddler, try giving them a bottle, pacifier, or something to drink or suck on.  Warm compress: Applying a warm, moist cloth to the back of your ear can help reduce swelling and help drain congested passages. In some cases, these interventions will cause the ears will pop without trying. If they don't, give it 20 minutes and see if swallowing, yawning, chewing gum, or sucking on hard candy helps.  Step 2 If these methods alone don't help, you can try other interventions like:  Decongestants: OTC drugs like Afrin (oxymetazoline) or Sudafed (pseudoephedrine) work by reducing the swelling of blood vessels in the nasal passages and Eustachian tubes.  Never use these medications for more than a few days at a time, especially afrin is dependency-forming  If this isn't working or you need more than a few days of afrin or sudafed... you should make an appointment.   Step 3 (just for mod severe ear pressure and pain) If these interventions don't help, there are three other advanced strategies you can try called the Valsalva maneuver, the Toynbee maneuver, and the Frenzel maneuver.  Advanced Strategy 1:  The Valsalva maneuver Inhale. Pinch your nose shut with your fingers. Keeping your lips tightly shut, blow out forcefully as if you are blowing up a balloon. To increase the pressure, try bearing down as if having a bowel movement.  Advanced Strategy 2:  The Toynbee maneuver The Toynbee maneuver may also be safer than the Valsalva maneuver if you've had a previous eardrum injury. The Valsalva method exerts much more  pressure on the eardrum and can possibly cause a rupture if you blow too forcefully.  Keep your mouth tightly shut. Pinch your nose shut with your fingers. Swallow hard.  Advanced Strategy 3:  The Frenzel maneuver Pinch your nose shut with your fingers Close your mouth and place the tip of your tongue behind your upper front teeth. Push the back of your tongue to the roof of your mouth as if making a hard "G" or "K" sound. The back of your tongue will touch the roof. While doing this, close your vocal folds at the back of your throat and lift your larynx (voice box) up to push the air out of your mouth and into your nose.  ------------------------------------------------------------------------ If all this fails despite extensive efforts then you need to go to an ear nose and throat for surgical correction - but this should not be tried until everything else fails.

## 2022-07-07 ENCOUNTER — Encounter: Payer: Self-pay | Admitting: Internal Medicine

## 2022-07-07 DIAGNOSIS — H6693 Otitis media, unspecified, bilateral: Secondary | ICD-10-CM | POA: Insufficient documentation

## 2022-07-07 DIAGNOSIS — F99 Mental disorder, not otherwise specified: Secondary | ICD-10-CM | POA: Insufficient documentation

## 2022-07-07 DIAGNOSIS — E559 Vitamin D deficiency, unspecified: Secondary | ICD-10-CM | POA: Insufficient documentation

## 2022-07-07 DIAGNOSIS — R5383 Other fatigue: Secondary | ICD-10-CM | POA: Insufficient documentation
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
## 2022-07-10 ENCOUNTER — Encounter: Payer: Self-pay | Admitting: Internal Medicine

## 2022-07-10 NOTE — Addendum Note (Signed)
Addended by: Loralee Pacas on: 07/10/2022 08:49 PM   Modules accepted: Orders

## 2022-07-11 LAB — IRON,TIBC AND FERRITIN PANEL
%SAT: 27 % (calc) (ref 16–45)
Ferritin: 34 ng/mL (ref 16–154)
Iron: 86 ug/dL (ref 40–190)
TIBC: 322 mcg/dL (calc) (ref 250–450)

## 2022-07-11 LAB — COMPREHENSIVE METABOLIC PANEL
AG Ratio: 1.9 (calc) (ref 1.0–2.5)
ALT: 14 U/L (ref 6–29)
AST: 16 U/L (ref 10–30)
Albumin: 5 g/dL (ref 3.6–5.1)
Alkaline phosphatase (APISO): 51 U/L (ref 31–125)
BUN: 12 mg/dL (ref 7–25)
CO2: 28 mmol/L (ref 20–32)
Calcium: 9.8 mg/dL (ref 8.6–10.2)
Chloride: 105 mmol/L (ref 98–110)
Creat: 0.91 mg/dL (ref 0.50–0.96)
Globulin: 2.6 g/dL (calc) (ref 1.9–3.7)
Glucose, Bld: 91 mg/dL (ref 65–99)
Potassium: 4.3 mmol/L (ref 3.5–5.3)
Sodium: 141 mmol/L (ref 135–146)
Total Bilirubin: 0.6 mg/dL (ref 0.2–1.2)
Total Protein: 7.6 g/dL (ref 6.1–8.1)

## 2022-07-11 LAB — HEMOGLOBIN A1C
Hgb A1c MFr Bld: 5.5 % of total Hgb (ref <5.7)
Mean Plasma Glucose: 111 mg/dL
eAG (mmol/L): 6.2 mmol/L

## 2022-07-11 LAB — CBC
HCT: 42.5 % (ref 35.0–45.0)
Hemoglobin: 14.4 g/dL (ref 11.7–15.5)
MCH: 29.5 pg (ref 27.0–33.0)
MCHC: 33.9 g/dL (ref 32.0–36.0)
MCV: 87.1 fL (ref 80.0–100.0)
MPV: 9.6 fL (ref 7.5–12.5)
Platelets: 329 10*3/uL (ref 140–400)
RBC: 4.88 10*6/uL (ref 3.80–5.10)
RDW: 12.2 % (ref 11.0–15.0)
WBC: 7.7 10*3/uL (ref 3.8–10.8)

## 2022-07-11 LAB — VITAMIN B12: Vitamin B-12: 579 pg/mL (ref 200–1100)

## 2022-07-11 LAB — HIV ANTIBODY (ROUTINE TESTING W REFLEX): HIV 1&2 Ab, 4th Generation: NONREACTIVE

## 2022-07-11 LAB — HCV RNA,QUANTITATIVE REAL TIME PCR
HCV Quantitative Log: <1.18 Log IU/mL
HCV RNA, PCR, QN: <15 IU/mL

## 2022-07-11 LAB — HEPATITIS C ANTIBODY: Hepatitis C Ab: REACTIVE — AB

## 2022-07-11 LAB — VITAMIN D 25 HYDROXY (VIT D DEFICIENCY, FRACTURES): Vit D, 25-Hydroxy: 27 ng/mL — ABNORMAL LOW (ref 30–100)

## 2022-07-11 LAB — TSH: TSH: 1.45 mIU/L

## 2022-07-25 ENCOUNTER — Encounter: Payer: Self-pay | Admitting: Neurology

## 2022-08-14 DIAGNOSIS — F331 Major depressive disorder, recurrent, moderate: Secondary | ICD-10-CM | POA: Diagnosis not present

## 2022-08-14 DIAGNOSIS — F4312 Post-traumatic stress disorder, chronic: Secondary | ICD-10-CM | POA: Diagnosis not present

## 2022-08-27 DIAGNOSIS — F331 Major depressive disorder, recurrent, moderate: Secondary | ICD-10-CM | POA: Diagnosis not present

## 2022-08-27 DIAGNOSIS — F4389 Other reactions to severe stress: Secondary | ICD-10-CM | POA: Diagnosis not present

## 2022-09-05 DIAGNOSIS — Z124 Encounter for screening for malignant neoplasm of cervix: Secondary | ICD-10-CM | POA: Diagnosis not present

## 2022-09-05 DIAGNOSIS — R87611 Atypical squamous cells cannot exclude high grade squamous intraepithelial lesion on cytologic smear of cervix (ASC-H): Secondary | ICD-10-CM | POA: Diagnosis not present

## 2022-09-05 DIAGNOSIS — N952 Postmenopausal atrophic vaginitis: Secondary | ICD-10-CM | POA: Diagnosis not present

## 2022-09-05 DIAGNOSIS — F4389 Other reactions to severe stress: Secondary | ICD-10-CM | POA: Diagnosis not present

## 2022-09-05 DIAGNOSIS — N926 Irregular menstruation, unspecified: Secondary | ICD-10-CM | POA: Diagnosis not present

## 2022-09-05 DIAGNOSIS — Z1151 Encounter for screening for human papillomavirus (HPV): Secondary | ICD-10-CM | POA: Diagnosis not present

## 2022-09-05 DIAGNOSIS — F331 Major depressive disorder, recurrent, moderate: Secondary | ICD-10-CM | POA: Diagnosis not present

## 2022-09-27 DIAGNOSIS — F331 Major depressive disorder, recurrent, moderate: Secondary | ICD-10-CM | POA: Diagnosis not present

## 2022-09-27 DIAGNOSIS — F4389 Other reactions to severe stress: Secondary | ICD-10-CM | POA: Diagnosis not present

## 2022-10-08 ENCOUNTER — Ambulatory Visit: Payer: Medicaid Other | Admitting: Internal Medicine

## 2022-10-17 DIAGNOSIS — F331 Major depressive disorder, recurrent, moderate: Secondary | ICD-10-CM | POA: Diagnosis not present

## 2022-10-17 DIAGNOSIS — F4389 Other reactions to severe stress: Secondary | ICD-10-CM | POA: Diagnosis not present
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
## 2022-11-23 ENCOUNTER — Ambulatory Visit: Payer: Medicaid Other | Admitting: Neurology
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
## 2022-12-19 ENCOUNTER — Ambulatory Visit (INDEPENDENT_AMBULATORY_CARE_PROVIDER_SITE_OTHER): Payer: Medicaid Other | Admitting: Neurology

## 2022-12-19 ENCOUNTER — Encounter: Payer: Self-pay | Admitting: Neurology

## 2022-12-19 VITALS — BP 97/61 | HR 59 | Ht 67.0 in | Wt 140.0 lb

## 2022-12-19 DIAGNOSIS — Z87820 Personal history of traumatic brain injury: Secondary | ICD-10-CM | POA: Diagnosis not present

## 2022-12-19 DIAGNOSIS — G43019 Migraine without aura, intractable, without status migrainosus: Secondary | ICD-10-CM

## 2022-12-19 MED ORDER — NORTRIPTYLINE HCL 10 MG PO CAPS: 10.0000 mg | ORAL_CAPSULE | Freq: Every day | ORAL | 5 refills | Status: DC

## 2022-12-19 MED ORDER — SUMATRIPTAN SUCCINATE 100 MG PO TABS: ORAL_TABLET | ORAL | 5 refills | Status: DC

## 2022-12-19 NOTE — Patient Instructions (Addendum)
Discuss medications with your baby's pediatrician to make sure they have no objections.  Start nortriptyline 10mg  at bedtime.  If no improvement in 6 to 8 weeks, contact me and we can increase dose. Take sumatriptan at earliest onset of headache.  May repeat after 2 hours.  Maximum 2 tablets in 24 hours.  Hold breastfeeding for 8 hours after use.   Limit use of pain relievers to no more than 2 days out of week to prevent risk of rebound or medication-overuse headache. Keep headache diary Follow up 6-7 months.

## 2022-12-31 ENCOUNTER — Ambulatory Visit: Payer: Medicaid Other | Admitting: Internal Medicine

## 2023-01-07 ENCOUNTER — Ambulatory Visit: Payer: Medicaid Other | Admitting: Internal Medicine

## 2023-01-16 ENCOUNTER — Ambulatory Visit: Payer: Medicaid Other | Admitting: Internal Medicine

## 2023-01-22 ENCOUNTER — Telehealth: Payer: Self-pay | Admitting: Internal Medicine

## 2023-01-22 ENCOUNTER — Ambulatory Visit: Payer: Medicaid Other | Admitting: Internal Medicine

## 2023-01-22 NOTE — Telephone Encounter (Signed)
LVM informing pt of no show status from today's visit. I offered pt to call back for reschedule.

## 2023-01-31 ENCOUNTER — Telehealth: Payer: Medicaid Other | Admitting: Family Medicine

## 2023-01-31 ENCOUNTER — Other Ambulatory Visit: Payer: Self-pay | Admitting: Family Medicine

## 2023-01-31 DIAGNOSIS — B852 Pediculosis, unspecified: Secondary | ICD-10-CM

## 2023-01-31 DIAGNOSIS — L299 Pruritus, unspecified: Secondary | ICD-10-CM

## 2023-01-31 MED ORDER — IVERMECTIN 0.5 % EX LOTN: TOPICAL_LOTION | CUTANEOUS | 1 refills | Status: DC

## 2023-01-31 NOTE — Progress Notes (Signed)
Virtual Visit via Video Note  I connected with Paula Burch  on 01/31/23 at  4:20 PM EDT by a video enabled telemedicine application and verified that I am speaking with the correct person using two identifiers.  Location patient: [place] Location provider:work or home office Persons participating in the virtual visit: patient, provider  I discussed the limitations and requested verbal permission for telemedicine visit. The patient expressed understanding and agreed to proceed.   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   ROS: See pertinent positives and negatives per HPI.  Past Medical History:  Diagnosis Date   Anxiety    Depression    Full-term PROM with onset of labor within 24 hours of rupture 04/11/2021   History of shoulder dystocia in prior pregnancy 08/04/2020   Irritability 07/06/2022   Could be brain injury, genetic or situational.   Marijuana use 08/14/2020   Formatting of this note might be different from the original.  08/11/20 Quit two weeks ago.   Smoker 08/14/2020   Formatting of this note might be different from the original.  08/12/20 Quitting. Down to 5 cig/day from 1/2 PPD.   Supervision of high-risk pregnancy 11/14/2020   Vaginal bleeding in pregnancy 08/14/2020   Formatting of this note might be different from the original.  09/05/20: Could not check progesterone b/c patient took prog last night  Recheck progesterone with 10wk labs  08/11/20 Prog 11.8 @ ~6w,  Plan Prog 200mg  PO/PV q HS     LMP 06/29/20; +HPT 08/01/20    Past Surgical History:  Procedure Laterality Date   BACK SURGERY     JOINT REPLACEMENT  11/2014   SPINE SURGERY  11/2014     Current Outpatient Medications:    Ivermectin 0.5 % LOTN, Please apply thoroughly to clean dry hair. Leave on 10 minutes, then rinse thoroughly. May repeat once in 7-9 days. Do not apply more frequently or repeat again without seeing a doctor inperson first., Disp: 117 g, Rfl: 1   ibuprofen (ADVIL) 800 MG tablet, Take 800 mg by mouth every 6 (six) hours as needed., Disp: , Rfl:    nortriptyline (PAMELOR) 10 MG capsule, Take 1 capsule (10 mg total) by mouth at bedtime., Disp: 30 capsule, Rfl: 5   Prenat-FeBis-FePro-FA-CA-Omega (PR NATAL 430 EC) 29-1-200 & 430 MG (DR) MISC, Take by mouth., Disp: , Rfl:    Saline (SIMPLY SALINE) 0.9 % AERS, Place 2 each into the nose as directed. Use nightly for sinus hygiene long-term.  Can also be used as many times daily as desired to assist with clearing congested sinuses., Disp: 127 mL, Rfl: 11   SUMAtriptan (IMITREX) 100 MG tablet, Take 1 tablet at earliest onset of headache.  May repeat in 2 hours if headache persists or recurs.  Maximum 2 tablets in 24 hours.  Hold breastfeeding for 8 hours after use., Disp: 10 tablet, Rfl: 5  EXAM:  VITALS per patient if applicable:  GENERAL: alert, oriented, appears well and  in no acute distress  HEENT: atraumatic, conjunttiva clear, no obvious abnormalities on inspection of external nose and ears  NECK: normal movements of the head and neck  LUNGS: on inspection no signs of respiratory distress, breathing rate appears normal, no obvious gross SOB, gasping or wheezing  CV: no obvious cyanosis  MS: moves all visible extremities without noticeable abnormality  PSYCH/NEURO: pleasant and cooperative, no obvious depression or anxiety, speech and thought processing grossly intact  ASSESSMENT AND PLAN:  Discussed the following  assessment and plan:  Itchy scalp  Lice infestation  -we discussed possible serious and likely etiologies, options for evaluation and workup, limitations of telemedicine visit vs in person visit, treatment, treatment risks and precautions. Pt is agreeable to treatment via telemedicine at this moment. She wants to try the ivermectin lotion. Is aware to rinse completely and not get on other parts of her skin and to wash hand well and not get on breast or allow infant access to areas treated or the lotion/tube. Advised to peak with child's pediatrician regarding any treatment for the children.  Advised to seek prompt virtual visit or in person care if worsening, new symptoms arise, or if is not improving with treatment as expected per our conversation of expected course. Discussed options for follow up care. Did let this patient know that I do telemedicine on Tuesdays and Thursdays for [facility] and those are the days I am logged into the system. Advised to schedule follow up visit with PCP, [place] virtual visits or [facility] if any further questions or concerns to avoid delays in care.   I discussed the assessment and treatment plan with the patient. The patient was provided an opportunity to ask questions and all were answered. The patient agreed with the plan and demonstrated an understanding of the instructions.     Paula Koyanagi, DO

## 2023-01-31 NOTE — Patient Instructions (Signed)
Ivermectin Lotion What is this medication? IVERMECTIN (eye ver MEK tin) treats head lice, tiny insects that live in hair. It works by killing the lice. It does not kill the eggs, so a second treatment may be needed. This medicine may be used for other purposes; ask your health care provider or pharmacist if you have questions. COMMON BRAND NAME(S): Sklice What should I tell my care team before I take this medication? They need to know if you have any of these conditions: An unusual or allergic reaction to ivermectin, other medications, foods, dyes, or preservatives Pregnant or trying to get pregnant Breast-feeding How should I use this medication? This medication is for external use only. Do not take by mouth. Use it as directed on the product label. Do not wash hair or use conditioner or a combination shampoo-conditioner immediately before applying this medication. Follow product-specific instructions for length of application and product removal. All products should be rinsed from the hair over a sink or tub to limit skin exposure; do not use hot water. In general, do not re-wash the hair for 1 to 2 days after use. Talk to your care team about the use of this medication in children. While it may be prescribed for children as young as 6 months for selected conditions, precautions do apply. Overdosage: If you think you have taken too much of this medicine contact a poison control center or emergency room at once. NOTE: This medicine is only for you. Do not share this medicine with others. What if I miss a dose? This does not apply. This medication is not for regular use. What may interact with this medication? Interactions are not expected. Do not use any other skin products on the same area of skin without talking to your care team. This list may not describe all possible interactions. Give your health care provider a list of all the medicines, herbs, non-prescription drugs, or dietary  supplements you use. Also tell them if you smoke, drink alcohol, or use illegal drugs. Some items may interact with your medicine. What should I watch for while using this medication? This medication is used as a single application treatment. If live lice are observed after initial application, talk to your care team. Head lice is spread to others by direct contact with clothing, hats, scarves, bedding, towels, washcloths, hairbrushes, and combs. All members of your household should be examined for head lice. Anyone with lice should get treated. Ask your care team if you have questions. Do the following to prevent giving lice to others or getting a second infection. Machine wash all clothing, bedding, and towels in very hot water. Dry them in the dryer using the hot cycle for 20 minutes or longer. Shampoo wigs or hairpieces. Wash all hairbrushes and combs in very hot soapy water for 5 to 10 minutes. Do not share your hairbrushes or combs with other people. Wash all toys in very hot water for 5 to 10 minutes. Vacuum furniture, rugs, and floors. If clothing, bedding, rugs, or toys cannot be washed, seal them in an airtight plastic bag for 2 weeks. What side effects may I notice from receiving this medication? Side effects that you should report to your care team as soon as possible: Allergic reactions--skin rash, itching, hives, swelling of the face, lips, tongue, or throat Side effects that usually do not require medical attention (report to your care team if they continue or are bothersome): Burning or pain at application site Eye irritation or itching Irritation  at application site This list may not describe all possible side effects. Call your doctor for medical advice about side effects. You may report side effects to FDA at 1-800-FDA-1088. Where should I keep my medication? Keep out of the reach of children and pets. Store at room temperature between 20 and 25 degrees C (68 and 77 degrees F). Do  not freeze. Get rid of any unused medication after the expiration date. To get rid of medications that are no longer needed or have expired: Take the medication to a medication take-back program. Check with your pharmacy or law enforcement to find a location. If you cannot return the medication, check the label or package insert to see if the medication should be thrown out in the garbage or flushed down the toilet. If you are not sure, ask your care team. If it is safe to put it in the trash, take the medication out of the container. Mix the medication with cat litter, dirt, coffee grounds, or other unwanted substance. Seal the mixture in a bag or container. Put it in the trash. NOTE: This sheet is a summary. It may not cover all possible information. If you have questions about this medicine, talk to your doctor, pharmacist, or health care provider.  2024 Elsevier/Gold Standard (2021-06-15 00:00:00) Lice, Adult     Lice are tiny insects with claws on the ends of their legs. They are parasites, which means they need to live off another animal to survive. Lice hatch from little round eggs (nits) that are attached to the base of hairs. There are three different types of lice: Head lice. These lice live on the hair on a person's head. Body lice. These lice live on body hair. Pubic lice. These lice live on pubic hair. Lice can spread from one person to another. You cannot get lice from a pet. Lice crawl, they do not fly or jump. Lice cause skin irritation and intense itching in the area of the affected hair. Although having lice can be frustrating, it is not dangerous. Lice do not spread diseases. Treatment will usually eliminate symptoms within a few days. What are the causes? Head and body lice may be caused by: Very close contact with a person who has lice. Sharing items that touch the skin and hair with a person who has lice. These include personal items, such as hats, combs, brushes, towels,  clothing, pillowcases, or sheets. Lying on a bed, couch, or carpet that was recently used by someone with lice. Pubic lice are spread through sexual contact. What increases the risk? Although having lice is more common among young children, anyone can get lice. Warm weather increases the risk of getting lice. What are the signs or symptoms? Symptoms of this condition include: Itchiness in the affected area. Skin irritation. Rash or sores on the skin. The feeling of something moving in the hair. Tiny flakes or nits near the scalp. These may be white, yellow, or tan. Tiny bugs crawling on the hair, scalp, or genital area. Trouble sleeping, because lice are most active in the dark. How is this diagnosed? This condition is diagnosed based on: Signs and symptoms. A physical exam. Your health care provider will examine the affected area closely for live lice, nits, and empty nit cases. Nits are typically white, yellow, or tan in color. Empty nit cases are white. Lice are gray or brown in color and about the size of a sesame seed. How is this treated? Treatment for this condition includes:  Using a hair rinse that contains a mild insecticide to kill lice. Your health care provider will recommend a prescription or an over-the-counter hair rinse. Removing lice, nits, and nit cases using a comb or tweezers. Washing clothing, towels, and bedding in hot water. Dry them afterward. Bagging items that cannot be washed or vacuumed. Pregnant women should not use medicated shampoo or cream without first talking to a health care provider. Follow these instructions at home: Using medicated rinse Apply medicated hair rinse as told by your health care provider or as per the package instructions. Follow the label instructions carefully. General instructions for applying rinses may include these steps: Put on an old shirt or underwear, or use an old towel in case of staining from the hair rinse. Wash and  towel-dry your head or pubic area before applying the rinse if directed to do so. When your hair is dry, apply the rinse. Leave the rinse in your hair for the amount of time specified in the instructions. Rinse the area with water. Comb your wet hair with a fine-tooth comb. Comb it close to the skin. Comb from the root down to the ends, removing any lice, nits, or nit cases. A lice comb may be included with the medicated rinse. Do not wash your hair for 2 days while the medicine kills the lice. After the treatment, repeat combing out your hair and removing lice, nits, or nit cases from the hair every 2-3 days. Do this for about 2-3 weeks. After treatment, the remaining lice should be moving more slowly. Repeat the treatment in 7-10 days if necessary.  Removing lice from other items Use hot water to wash all towels, hats, scarves, jackets, bedding, and clothing that you have recently used. Dry the items using the hot air cycle. Put any non-washable items that may have been exposed into plastic bags. Keep the bags tightly closed for 2 weeks to kill any remaining lice. Make sure that there are no holes in the bags. Soak all combs and brushes in hot water for 5 to 10 minutes. Vacuum carpets, mattresses, and upholstered furniture to remove any loose hair. Do not use chemicals, which can be poisonous (toxic). Lice survive for only 1-2 days away from human skin. Nits may survive for up to 1 week. General instructions Remove any remaining lice, nits, or nit cases from hair using a fine-tooth comb. For head lice, ask your health care provider if other family members or close contacts should be examined or treated as well. For pubic lice, tell any sexual partners to get treatment. Keep all follow-up visits. This is important. Contact a health care provider if: You develop sores that look infected. Your rash or sores do not go away in 1 week. The lice or nits return or do not go away after  treatment. Summary Lice are tiny parasitic insects that live on the human body. A parasite needs to live off another animal to survive. Lice can spread from one person to another through close contact with a person who has lice or by sharing personal items, such as combs, brushes, towels, clothes, or hats. Pubic lice are spread through sexual contact. Lice can be treated with a medicated hair rinse. Follow your health care provider's instructions, or instructions on the package label, if you are being treated with this medicine. For head lice, ask your health care provider if other family members or close contacts should be examined or treated as well. For pubic lice, tell any sexual  partners to get treatment. This information is not intended to replace advice given to you by your health care provider. Make sure you discuss any questions you have with your health care provider. Document Revised: 07/03/2021 Document Reviewed: 06/13/2021 Elsevier Patient Education  2024 [employer].

## 2023-02-05 NOTE — Telephone Encounter (Signed)
Contacted pt pharmacy and spoke to [place]. She states they can help pt finding the OTC medicine for pt.   Follow up with pt and inform pt of above information. Verbalized understand.

## 2023-03-14 ENCOUNTER — Ambulatory Visit (INDEPENDENT_AMBULATORY_CARE_PROVIDER_SITE_OTHER): Payer: Medicaid Other | Admitting: Internal Medicine

## 2023-03-14 VITALS — BP 105/68 | HR 75 | Temp 98.4°F | Ht 67.0 in | Wt 132.4 lb

## 2023-03-14 DIAGNOSIS — R5383 Other fatigue: Secondary | ICD-10-CM | POA: Diagnosis not present

## 2023-03-14 DIAGNOSIS — G44321 Chronic post-traumatic headache, intractable: Secondary | ICD-10-CM

## 2023-03-14 DIAGNOSIS — R41840 Attention and concentration deficit: Secondary | ICD-10-CM | POA: Diagnosis not present

## 2023-03-14 DIAGNOSIS — Z87820 Personal history of traumatic brain injury: Secondary | ICD-10-CM | POA: Diagnosis not present

## 2023-03-14 MED ORDER — ATOMOXETINE HCL 40 MG PO CAPS: 40.0000 mg | ORAL_CAPSULE | Freq: Every day | ORAL | 2 refills | Status: DC

## 2023-03-14 MED ORDER — FISH OIL 1000 MG PO CAPS: 2.0000 | ORAL_CAPSULE | Freq: Two times a day (BID) | ORAL | 3 refills | Status: AC

## 2023-03-14 MED ORDER — MULTI-VITAMIN/MINERALS PO TABS: 1.0000 | ORAL_TABLET | Freq: Every day | ORAL | 3 refills | Status: AC

## 2023-03-14 NOTE — Assessment & Plan Note (Signed)
Traumatic Brain Injury The traumatic brain injury may be influencing their ADHD symptoms. We will closely monitor for any side effects from Strattera.

## 2023-03-14 NOTE — Patient Instructions (Addendum)
Please schedule follow up with   Address: [address], [place], [place] [address] Areas served: [place] Hours:  Open ? Closes 5:30?PM Phone: [phone] Appointments: [email or web address]   VISIT SUMMARY:  During today's visit, we discussed your ongoing symptoms that suggest Attention Deficit Hyperactivity Disorder (ADHD), your recent episode of severe shoulder pain, and the significant stress in your personal life. We also reviewed your history of traumatic brain injury and its potential impact on your symptoms. Additionally, we addressed your concerns about previous psychiatric care and your current health maintenance needs.  YOUR PLAN:  -ATTENTION-DEFICIT/HYPERACTIVITY DISORDER (ADHD): ADHD is a condition characterized by difficulties with attention, hyperactivity, and impulsiveness. We will refer you to [facility] for a psychiatric evaluation to obtain an official diagnosis. In the meantime, we have prescribed Strattera 40 mg daily, which may be increased to 80 mg, as it is a non-stimulant medication. We will revisit the possibility of using stimulant medications like Concerta and Adderall in the future.  -TRAUMATIC BRAIN INJURY: Your history of traumatic brain injury may be contributing to your ADHD symptoms. We will monitor you closely for any side effects from Strattera.  -POSTPARTUM DEPRESSION: Your postpartum depression has resolved, and no current treatment is necessary.   -GENERAL HEALTH MAINTENANCE: You have a low vitamin D level, which increases your risk for bone fractures. We recommend taking a daily multivitamin with B12, a fish oil supplement, and a calcium and vitamin D supplement. Additionally, maintaining a balanced diet and regular exercise is important for your overall health.  INSTRUCTIONS:  Please follow up in three months or sooner, depending on the outcome of your psychiatric evaluation and lab results. We will order a comprehensive lab panel,  including tests for thyroid function, B12, folate, metabolic panel, hormone imbalances, and iron studies, as well as an MRI for headaches related to your traumatic brain injury.
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
## 2023-03-15 NOTE — Addendum Note (Signed)
Addended by: Donzetta Starch on: 03/15/2023 09:00 AM   Modules accepted: Orders

## 2023-03-16 ENCOUNTER — Encounter: Payer: Self-pay | Admitting: Internal Medicine

## 2023-03-18 ENCOUNTER — Other Ambulatory Visit (INDEPENDENT_AMBULATORY_CARE_PROVIDER_SITE_OTHER): Payer: Medicaid Other

## 2023-03-18 DIAGNOSIS — R519 Headache, unspecified: Secondary | ICD-10-CM | POA: Diagnosis not present

## 2023-03-18 DIAGNOSIS — R41840 Attention and concentration deficit: Secondary | ICD-10-CM

## 2023-03-18 DIAGNOSIS — I959 Hypotension, unspecified: Secondary | ICD-10-CM | POA: Diagnosis not present

## 2023-03-18 DIAGNOSIS — R5383 Other fatigue: Secondary | ICD-10-CM | POA: Diagnosis not present

## 2023-03-18 DIAGNOSIS — R430 Anosmia: Secondary | ICD-10-CM | POA: Diagnosis not present

## 2023-03-18 DIAGNOSIS — E559 Vitamin D deficiency, unspecified: Secondary | ICD-10-CM | POA: Diagnosis not present

## 2023-03-18 LAB — LIPID PANEL
Cholesterol: 150 mg/dL (ref 0–200)
HDL: 42.2 mg/dL (ref >39.00)
LDL Cholesterol: 95 mg/dL (ref 0–99)
NonHDL: 107.81
Total CHOL/HDL Ratio: 4
Triglycerides: 64 mg/dL (ref 0.0–149.0)
VLDL: 12.8 mg/dL (ref 0.0–40.0)

## 2023-03-18 LAB — CBC WITH DIFFERENTIAL/PLATELET
Basophils Absolute: 0 10*3/uL (ref 0.0–0.1)
Basophils Relative: 0.4 % (ref 0.0–3.0)
Eosinophils Absolute: 0.2 10*3/uL (ref 0.0–0.7)
Eosinophils Relative: 3 % (ref 0.0–5.0)
HCT: 41.2 % (ref 36.0–46.0)
Hemoglobin: 14 g/dL (ref 12.0–15.0)
Lymphocytes Relative: 34.1 % (ref 12.0–46.0)
Lymphs Abs: 2 10*3/uL (ref 0.7–4.0)
MCHC: 33.9 g/dL (ref 30.0–36.0)
MCV: 89.3 fL (ref 78.0–100.0)
Monocytes Absolute: 0.3 10*3/uL (ref 0.1–1.0)
Monocytes Relative: 4.6 % (ref 3.0–12.0)
Neutro Abs: 3.4 10*3/uL (ref 1.4–7.7)
Neutrophils Relative %: 57.9 % (ref 43.0–77.0)
Platelets: 344 10*3/uL (ref 150.0–400.0)
RBC: 4.61 Mil/uL (ref 3.87–5.11)
RDW: 13.2 % (ref 11.5–15.5)
WBC: 5.9 10*3/uL (ref 4.0–10.5)

## 2023-03-18 LAB — COMPREHENSIVE METABOLIC PANEL
ALT: 11 U/L (ref 0–35)
AST: 11 U/L (ref 0–37)
Albumin: 4.6 g/dL (ref 3.5–5.2)
Alkaline Phosphatase: 45 U/L (ref 39–117)
BUN: 14 mg/dL (ref 6–23)
CO2: 26 meq/L (ref 19–32)
Calcium: 9.7 mg/dL (ref 8.4–10.5)
Chloride: 105 meq/L (ref 96–112)
Creatinine, Ser: 0.86 mg/dL (ref 0.40–1.20)
GFR: 91.12 mL/min (ref >60.00)
Glucose, Bld: 90 mg/dL (ref 70–99)
Potassium: 4 meq/L (ref 3.5–5.1)
Sodium: 141 meq/L (ref 135–145)
Total Bilirubin: 0.6 mg/dL (ref 0.2–1.2)
Total Protein: 6.7 g/dL (ref 6.0–8.3)

## 2023-03-18 LAB — C-REACTIVE PROTEIN: CRP: <1 mg/dL (ref 0.5–20.0)

## 2023-03-18 LAB — CORTISOL: Cortisol, Plasma: 8.4 ug/dL

## 2023-03-19 LAB — LIPID PANEL W/REFLEX DIRECT LDL
Cholesterol: 158 mg/dL (ref <200)
HDL: 53 mg/dL (ref >=50)
LDL Cholesterol (Calc): 90 mg/dL
Non-HDL Cholesterol (Calc): 105 mg/dL (ref <130)
Total CHOL/HDL Ratio: 3 (calc) (ref <5.0)
Triglycerides: 66 mg/dL (ref <150)

## 2023-03-19 LAB — TSH+FREE T4: TSH W/REFLEX TO FT4: 1.52 m[IU]/L

## 2023-03-19 LAB — IRON,TIBC AND FERRITIN PANEL
%SAT: 18 % (ref 16–45)
Ferritin: 33 ng/mL (ref 16–154)
Iron: 57 ug/dL (ref 40–190)
TIBC: 319 ug/dL (ref 250–450)

## 2023-03-20 ENCOUNTER — Encounter: Payer: Self-pay | Admitting: Internal Medicine

## 2023-03-23 LAB — ALB+PRL+FSH+LH+PROG+DHEA+ES...
Albumin: 4.6 g/dL (ref 4.0–5.0)
Dehydroepiandrosterone: 339 ng/dL (ref 31–701)
Estrogen: 57 pg/mL
FSH: 6.5 m[IU]/mL
LH: 5.2 m[IU]/mL
Progesterone: 0.4 ng/mL
Prolactin: 4.7 ng/mL — ABNORMAL LOW (ref 4.8–33.4)
Sex Hormone Binding: 68.9 nmol/L (ref 24.6–122.0)
Vit D, 25-Hydroxy: 48.6 ng/mL (ref 30.0–100.0)

## 2023-03-24 ENCOUNTER — Encounter: Payer: Self-pay | Admitting: Internal Medicine

## 2023-03-24 DIAGNOSIS — R41840 Attention and concentration deficit: Secondary | ICD-10-CM | POA: Insufficient documentation

## 2023-03-25 ENCOUNTER — Telehealth: Payer: Self-pay | Admitting: Internal Medicine

## 2023-03-25 DIAGNOSIS — Z8781 Personal history of (healed) traumatic fracture: Secondary | ICD-10-CM

## 2023-03-25 DIAGNOSIS — Z87820 Personal history of traumatic brain injury: Secondary | ICD-10-CM

## 2023-03-25 DIAGNOSIS — Z8759 Personal history of other complications of pregnancy, childbirth and the puerperium: Secondary | ICD-10-CM

## 2023-03-25 MED ORDER — ATOMOXETINE HCL 18 MG PO CAPS: 18.0000 mg | ORAL_CAPSULE | Freq: Every day | ORAL | 2 refills | Status: DC

## 2023-03-25 NOTE — Telephone Encounter (Signed)
Patient called to check if medical records from [facility] had been reviewed. I informed pt that these were received on 11/25. Please advise.

## 2023-03-26 ENCOUNTER — Encounter: Payer: Self-pay | Admitting: Internal Medicine

## 2023-03-26 DIAGNOSIS — Z8781 Personal history of (healed) traumatic fracture: Secondary | ICD-10-CM | POA: Insufficient documentation

## 2023-03-26 DIAGNOSIS — R430 Anosmia: Secondary | ICD-10-CM

## 2023-03-27 ENCOUNTER — Ambulatory Visit: Payer: Medicaid Other | Admitting: Neurology

## 2023-03-29 NOTE — Addendum Note (Signed)
Addended by: Donzetta Starch on: 03/29/2023 06:23 PM   Modules accepted: Orders

## 2023-03-29 NOTE — Telephone Encounter (Signed)
Patient's review of lab results/notes confirmed. Changed her MRI imaging from future to normal (referral team can't see it as future).

## 2023-03-31 DIAGNOSIS — R430 Anosmia: Secondary | ICD-10-CM | POA: Insufficient documentation

## 2023-04-01 ENCOUNTER — Encounter: Payer: Self-pay | Admitting: Internal Medicine

## 2023-04-01 DIAGNOSIS — F4312 Post-traumatic stress disorder, chronic: Secondary | ICD-10-CM | POA: Diagnosis not present

## 2023-04-01 DIAGNOSIS — F331 Major depressive disorder, recurrent, moderate: Secondary | ICD-10-CM | POA: Diagnosis not present

## 2023-04-10 DIAGNOSIS — F900 Attention-deficit hyperactivity disorder, predominantly inattentive type: Secondary | ICD-10-CM | POA: Diagnosis not present

## 2023-04-10 DIAGNOSIS — F331 Major depressive disorder, recurrent, moderate: Secondary | ICD-10-CM | POA: Diagnosis not present

## 2023-04-10 DIAGNOSIS — F4312 Post-traumatic stress disorder, chronic: Secondary | ICD-10-CM | POA: Diagnosis not present

## 2023-04-19 ENCOUNTER — Inpatient Hospital Stay: Admission: RE | Admit: 2023-04-19 | Payer: Medicaid Other | Source: Ambulatory Visit

## 2023-04-29 DIAGNOSIS — F4312 Post-traumatic stress disorder, chronic: Secondary | ICD-10-CM | POA: Diagnosis not present

## 2023-04-29 DIAGNOSIS — F900 Attention-deficit hyperactivity disorder, predominantly inattentive type: Secondary | ICD-10-CM | POA: Diagnosis not present

## 2023-04-29 DIAGNOSIS — F331 Major depressive disorder, recurrent, moderate: Secondary | ICD-10-CM | POA: Diagnosis not present

## 2023-05-14 DIAGNOSIS — F331 Major depressive disorder, recurrent, moderate: Secondary | ICD-10-CM | POA: Diagnosis not present

## 2023-05-14 DIAGNOSIS — F900 Attention-deficit hyperactivity disorder, predominantly inattentive type: Secondary | ICD-10-CM | POA: Diagnosis not present

## 2023-05-14 DIAGNOSIS — F4312 Post-traumatic stress disorder, chronic: Secondary | ICD-10-CM | POA: Diagnosis not present

## 2023-05-24 ENCOUNTER — Ambulatory Visit: Payer: Medicaid Other | Admitting: Internal Medicine

## 2023-05-29 DIAGNOSIS — F900 Attention-deficit hyperactivity disorder, predominantly inattentive type: Secondary | ICD-10-CM | POA: Diagnosis not present

## 2023-05-29 DIAGNOSIS — F331 Major depressive disorder, recurrent, moderate: Secondary | ICD-10-CM | POA: Diagnosis not present

## 2023-05-29 DIAGNOSIS — F4312 Post-traumatic stress disorder, chronic: Secondary | ICD-10-CM | POA: Diagnosis not present

## 2023-06-06 ENCOUNTER — Ambulatory Visit: Payer: Medicaid Other | Admitting: Internal Medicine

## 2023-06-14 ENCOUNTER — Ambulatory Visit: Payer: Medicaid Other | Admitting: Internal Medicine

## 2023-06-19 DIAGNOSIS — F900 Attention-deficit hyperactivity disorder, predominantly inattentive type: Secondary | ICD-10-CM | POA: Diagnosis not present

## 2023-06-19 DIAGNOSIS — F4312 Post-traumatic stress disorder, chronic: Secondary | ICD-10-CM | POA: Diagnosis not present

## 2023-06-19 DIAGNOSIS — F331 Major depressive disorder, recurrent, moderate: Secondary | ICD-10-CM | POA: Diagnosis not present
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
## 2023-06-21 ENCOUNTER — Ambulatory Visit: Payer: Medicaid Other | Admitting: Neurology

## 2023-06-26 ENCOUNTER — Ambulatory Visit (HOSPITAL_COMMUNITY): Payer: Self-pay | Admitting: Mental Health

## 2023-06-26 DIAGNOSIS — F4310 Post-traumatic stress disorder, unspecified: Secondary | ICD-10-CM | POA: Diagnosis not present
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
## 2023-07-01 ENCOUNTER — Ambulatory Visit: Payer: Medicaid Other | Admitting: Internal Medicine

## 2023-07-17 DIAGNOSIS — F4312 Post-traumatic stress disorder, chronic: Secondary | ICD-10-CM | POA: Diagnosis not present

## 2023-07-17 DIAGNOSIS — F900 Attention-deficit hyperactivity disorder, predominantly inattentive type: Secondary | ICD-10-CM | POA: Diagnosis not present

## 2023-07-17 DIAGNOSIS — F331 Major depressive disorder, recurrent, moderate: Secondary | ICD-10-CM | POA: Diagnosis not present

## 2023-08-07 DIAGNOSIS — F4312 Post-traumatic stress disorder, chronic: Secondary | ICD-10-CM | POA: Diagnosis not present

## 2023-08-07 DIAGNOSIS — F900 Attention-deficit hyperactivity disorder, predominantly inattentive type: Secondary | ICD-10-CM | POA: Diagnosis not present

## 2023-08-07 DIAGNOSIS — F331 Major depressive disorder, recurrent, moderate: Secondary | ICD-10-CM | POA: Diagnosis not present

## 2023-08-19 DIAGNOSIS — F331 Major depressive disorder, recurrent, moderate: Secondary | ICD-10-CM | POA: Diagnosis not present

## 2023-08-19 DIAGNOSIS — F900 Attention-deficit hyperactivity disorder, predominantly inattentive type: Secondary | ICD-10-CM | POA: Diagnosis not present

## 2023-08-19 DIAGNOSIS — F4312 Post-traumatic stress disorder, chronic: Secondary | ICD-10-CM | POA: Diagnosis not present

## 2023-08-21 ENCOUNTER — Ambulatory Visit (HOSPITAL_COMMUNITY): Payer: Self-pay | Admitting: Mental Health

## 2023-09-09 ENCOUNTER — Encounter: Payer: Self-pay | Admitting: Internal Medicine

## 2023-09-13 DIAGNOSIS — F4312 Post-traumatic stress disorder, chronic: Secondary | ICD-10-CM | POA: Diagnosis not present

## 2023-09-13 DIAGNOSIS — F900 Attention-deficit hyperactivity disorder, predominantly inattentive type: Secondary | ICD-10-CM | POA: Diagnosis not present

## 2023-09-13 DIAGNOSIS — F331 Major depressive disorder, recurrent, moderate: Secondary | ICD-10-CM | POA: Diagnosis not present

## 2023-09-15 ENCOUNTER — Ambulatory Visit
Admission: RE | Admit: 2023-09-15 | Discharge: 2023-09-15 | Disposition: A | Discharge status: Home / Self Care | Source: Ambulatory Visit | Attending: Internal Medicine | Admitting: Internal Medicine

## 2023-09-15 MED ORDER — GADOPICLENOL 0.5 MMOL/ML IV SOLN
6.0000 mL | Freq: Once | INTRAVENOUS | Status: AC | PRN
Start: 2023-09-15 — End: 2023-09-15
  Administered 2023-09-15: 6 mL via INTRAVENOUS

## 2023-09-23 ENCOUNTER — Encounter: Payer: Self-pay | Admitting: Internal Medicine

## 2023-09-23 DIAGNOSIS — Z209 Contact with and (suspected) exposure to unspecified communicable disease: Secondary | ICD-10-CM

## 2023-09-24 ENCOUNTER — Telehealth: Admitting: Physician Assistant

## 2023-09-24 ENCOUNTER — Telehealth: Payer: Self-pay

## 2023-09-24 DIAGNOSIS — Z207 Contact with and (suspected) exposure to pediculosis, acariasis and other infestations: Secondary | ICD-10-CM

## 2023-09-24 MED ORDER — ALBENDAZOLE 200 MG PO TABS: 400.0000 mg | ORAL_TABLET | Freq: Once | ORAL | 0 refills | Status: AC

## 2023-09-24 NOTE — Progress Notes (Signed)
 Virtual Visit Consent   Paula Burch, you are scheduled for a virtual visit with a [facility] provider today. Just as with appointments in the office, your consent must be obtained to participate. Your consent will be active for this visit and any virtual visit you may have with one of our providers in the next 365 days. If you have a MyChart account, a copy of this consent can be sent to you electronically.  As this is a virtual visit, video technology does not allow for your provider to perform a traditional examination. This may limit your provider's ability to fully assess your condition. If your provider identifies any concerns that need to be evaluated in person or the need to arrange testing (such as labs, EKG, etc.), we will make arrangements to do so. Although advances in technology are sophisticated, we cannot ensure that it will always work on either your end or our end. If the connection with a video visit is poor, the visit may have to be switched to a telephone visit. With either a video or telephone visit, we are not always able to ensure that we have a secure connection.  By engaging in this virtual visit, you consent to the provision of healthcare and authorize for your insurance to be billed (if applicable) for the services provided during this visit. Depending on your insurance coverage, you may receive a charge related to this service.  I need to obtain your verbal consent now. Are you willing to proceed with your visit today? Paula Burch has provided verbal consent on 09/24/2023 for a virtual visit (video or telephone). Paula Burch, [place]  Date: 09/24/2023 6:13 PM   Virtual Visit via Video Note   I, Jossalin Chervenak, connected with  Paula Burch  ([id number], 02-Apr-1996) on 09/24/23 at  5:30 PM EDT by a video-enabled telemedicine application and verified that I am speaking with the correct person using two identifiers.  Location: Patient: Virtual Visit  Location Patient: Home Provider: Virtual Visit Location Provider: Home Office   I discussed the limitations of evaluation and management by telemedicine and the availability of in person appointments. The patient expressed understanding and agreed to proceed.    History of Present Illness: Paula Burch is a 28 y.o. who identifies as a female who was assigned female at birth, and is being seen today for multiple general complaints.  HPI: 28y/o F presents to video telehealth visit for c/o itching recently as well nausea, constipation, hot flashes, no appetite and late menses. She denies being pregnant, her husband had a vasectomy. Pt's spouse is confirmed with a positive stool test for roundworm parasite infection. She hikes uses public bathrooms a lot. She is afraid that she could have picked up the same type of parasitic infection as her husband while traveling in the mountains or staying on the beach. She also has multiple stray cats in and around the house.     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   History of compression fracture of spine 03/26/2023   Concentration deficit 03/24/2023   Other fatigue 07/07/2022   Otitis of both ears 07/07/2022   Psychiatric disturbance 07/07/2022   Vitamin D  deficiency 07/07/2022   PTSD (post-traumatic stress disorder) 07/06/2022   Irritability 07/06/2022   Low BP 07/06/2022   Pap smear of cervix with ASCUS, cannot exclude HGSIL 06/03/2021   Antepartum tobacco use less than before pregnancy 09/05/2020   H/O macrosomia  in infant in prior pregnancy, currently pregnant, first trimester 09/05/2020   History of delivery of macrosomal infant 08/14/2020   History of spinal surgery 08/14/2020   Headache 08/04/2020   History of traumatic brain injury 04/23/2014    Allergies: No Known Allergies Medications:  Current Outpatient Medications:    albendazole (ALBENZA) 200 MG tablet, Take 2 tablets (400 mg  total) by mouth once for 1 dose., Disp: 2 tablet, Rfl: 0   atomoxetine  (STRATTERA ) 18 MG capsule, Take 1 capsule (18 mg total) by mouth daily., Disp: 30 capsule, Rfl: 2   ibuprofen (ADVIL) 800 MG tablet, Take 800 mg by mouth every 6 (six) hours as needed., Disp: , Rfl:    Multiple Vitamins-Minerals (MULTIVITAMIN WITH MINERALS) tablet, Take 1 tablet by mouth daily., Disp: 90 tablet, Rfl: 3   Omega-3 Fatty Acids (FISH OIL ) 1000 MG CAPS, Take 2 capsules (2,000 mg total) by mouth 2 (two) times daily., Disp: 90 capsule, Rfl: 3   VITAMIN D  PO, Take by mouth daily., Disp: , Rfl:   Observations/Objective: Patient is well-developed, well-nourished in no acute distress.  Resting comfortably  at home.  Head is normocephalic, atraumatic.  No labored breathing.  Speech is clear and coherent with logical content.  Patient is alert and oriented at baseline.    Assessment and Plan: 1. Exposure to parasitic disease (Primary) - albendazole (ALBENZA) 200 MG tablet; Take 2 tablets (400 mg total) by mouth once for 1 dose.  Dispense: 2 tablet; Refill: 0  Await testing being ordered by your PCP. Once the test confirms a parasitic infection then start medicine as prescribed. Keep the virtual appointment as scheduled in two days.  Pt verbalized understanding and in agreement.    Follow Up Instructions: I discussed the assessment and treatment plan with the patient. The patient was provided an opportunity to ask questions and all were answered. The patient agreed with the plan and demonstrated an understanding of the instructions.  A copy of instructions were sent to the patient via MyChart unless otherwise noted below.   Patient has requested to receive PHI (AVS, Work Notes, etc) pertaining to this video visit through e-mail as they are currently without active MyChart. They have voiced understand that email is not considered secure and their health information could be viewed by someone other than the patient.    The patient was advised to call back or seek an in-person evaluation if the symptoms worsen or if the condition fails to improve as anticipated.    Mandela Bello, PA-C

## 2023-09-24 NOTE — Telephone Encounter (Signed)
 MyChart secure digital messaging clinical encounter Chief Complaint: Patient requests empirical strongyloides treatment without examination following husband's recent strongyloides stercoralis diagnosis. Reports she and children have symptoms and seeks treatment without testing. Relevant History: Spouse recently diagnosed with strongyloides stercoralis roundworm infection Patient and children experiencing symptoms potentially consistent with strongyloides exposure Unknown environmental source of exposure for index case No current immunosuppressive medications or therapy planned Patient implied request for empirical treatment prior to evaluation or testing Family concern about progression to severe disease as observed in spouse Assessment: Patient is spouse of confirmed strongyloides stercoralis case requesting empirical treatment. Person-to-person transmission of Strongyloides is rare but has been documented, and family members may have been exposed to same environmental source. Current presentation does not meet criteria for emergency empirical treatment per CDC guidelines, which recommend empirical treatment primarily for patients requiring immediate immunosuppression or when testing is unavailable. Based on current evidence-based guidelines, diagnostic evaluation is recommended prior to treatment initiation to: Confirm medical necessity for treatment Establish baseline for monitoring treatment response Rule out alternative diagnoses Ensure appropriate treatment approach Plan: Diagnostic Evaluation Recommended: Urgent appointment for comprehensive evaluation within 2-3 days Strongyloides serology (blood antibody test) Specialized stool examination with Baermann technique Physical examination with symptom assessment Complete blood count with differential (eosinophil count) Empirical Treatment Decision: Current guidelines support empirical treatment when immediate immunosuppression required or  testing unavailable Patient does not meet criteria for immediate empirical treatment as she is not immunocompromised and testing is available CDC recommends performing serologic testing for patients at risk for Strongyloides infection who will be placed on corticosteroids or other immunosuppressive drug regimens Will consider empirical treatment if testing is delayed beyond 7 days or if symptoms worsen Pediatric Referral: Children require evaluation by their pediatric provider Same diagnostic approach recommended for pediatric patients Patient Education: Explained rationale for testing-first approach per current medical guidelines Reviewed emergency warning signs requiring immediate care Discussed expected timeline for testing and treatment Provided information about strongyloides transmission and prevention Safety Planning: Emergency precautions reviewed extensively Clear instructions for urgent care needs Expedited appointment scheduling offered Medical Decision Making: Moderate complexity medical decision making based on: Number of diagnoses/management options: Multiple diagnostic possibilities requiring evaluation of symptomatic family contacts of confirmed strongyloides case Amount/complexity of data: Review of current CDC guidelines, strongyloides transmission data, family exposure history, and symptom assessment Risk assessment: Balance of treatment risks vs diagnostic delays, consideration of hyperinfection potential, evaluation of emergency empirical treatment criteria Decision to recommend evaluation before treatment is medically appropriate and follows current evidence-based guidelines. CDC guidelines recommend empirical treatment primarily for people deemed at risk of strongyloidiasis who require immediate immunosuppression. Patient does not currently meet these criteria, making diagnostic evaluation the preferred approach. Current Medical Guidelines Reference: CDC Yellow Book 2024 -  Strongyloidiasis guidelines recommend: "Perform serologic testing for patients at risk for Strongyloides infection who will be placed on corticosteroids or other immunosuppressive drug regimens. Consider empiric treatment in people deemed at risk of strongyloidiasis who require immediate immunosuppression." (CDC Travel Health - Yellow Book 2024) Follow-up: Urgent appointment within 2-3 days for evaluation and testing Results expected within 5-7 business days Will reassess for empirical treatment if testing delayed or symptoms worsen Patient advised to contact office for any worsening symptoms or emergency warning signs Please see the MyChart message reply(ies) for my assessment and plan. This patient gave consent for this Medical Advice Message and is aware that it may result in a bill to [employer], as well as the possibility of receiving a bill for  a co-payment or deductible. They are an established patient, but are not seeking medical advice exclusively about a problem treated during an in-person or video visit in the last seven days. I did not recommend an in-person or video visit within seven days of my reply. I spent a total of 18 minutes cumulative time within 7 days through [employer], including: Reviewing prior medical records and spouse's strongyloides diagnosis: Researching current CDC guidelines and strongyloides transmission data Evaluating family exposure risk and symptom assessment Crafting evidence-based, contextually relevant MyChart response Documenting encounter and coordinating urgent follow-up care: This documentation accurately reflects the time spent in managing and coordinating patient care as part of Evaluation and Management (E/M) services in compliance with AMA and CMS guidelines. The moderate complexity medical decision making was medically appropriate given the need to balance diagnostic accuracy with treatment urgency, evaluate current evidence-based  guidelines, and coordinate family care for potential parasitic exposure. Anthon Kins, MD

## 2023-09-24 NOTE — Patient Instructions (Signed)
  Chrisa Ennis Burch, thank you for joining [name], PA-C for today's virtual visit.  While this provider is not your primary care provider (PCP), if your PCP is located in our provider database this encounter information will be shared with them immediately following your visit.   A [facility] MyChart account gives you access to today's visit and all your visits, tests, and labs performed at [facility] " click here if you don't have a [facility] MyChart account or go to [email or web address]  Consent: (Patient) Paula Burch provided verbal consent for this virtual visit at the beginning of the encounter.  Current Medications:  Current Outpatient Medications:    albendazole (ALBENZA) 200 MG tablet, Take 2 tablets (400 mg total) by mouth once for 1 dose., Disp: 2 tablet, Rfl: 0   atomoxetine  (STRATTERA ) 18 MG capsule, Take 1 capsule (18 mg total) by mouth daily., Disp: 30 capsule, Rfl: 2   ibuprofen (ADVIL) 800 MG tablet, Take 800 mg by mouth every 6 (six) hours as needed., Disp: , Rfl:    Multiple Vitamins-Minerals (MULTIVITAMIN WITH MINERALS) tablet, Take 1 tablet by mouth daily., Disp: 90 tablet, Rfl: 3   Omega-3 Fatty Acids (FISH OIL ) 1000 MG CAPS, Take 2 capsules (2,000 mg total) by mouth 2 (two) times daily., Disp: 90 capsule, Rfl: 3   VITAMIN D  PO, Take by mouth daily., Disp: , Rfl:    Medications ordered in this encounter:  Meds ordered this encounter  Medications   albendazole (ALBENZA) 200 MG tablet    Sig: Take 2 tablets (400 mg total) by mouth once for 1 dose.    Dispense:  2 tablet    Refill:  0    Supervising Provider:   LAMPTEY, PHILIP O [[id number]]     *If you need refills on other medications prior to your next appointment, please contact your pharmacy*  Follow-Up: Call back or seek an in-person evaluation if the symptoms worsen or if the condition fails to improve as anticipated.  [facility] [phone]  Other  Instructions Await testing being ordered by your PCP. Once the test confirms a parasitic infection then start medicine as prescribed. Keep the virtual appointment as scheduled in two days.   If you have been instructed to have an in-person evaluation today at a local Urgent Care facility, please use the link below. It will take you to a list of all of our available [facility], including address, phone number and hours of operation. Please do not delay care.  [facility]  If you or a family member do not have a primary care provider, use the link below to schedule a visit and establish care. When you choose a [facility] primary care physician or advanced practice provider, you gain a long-term partner in health. Find a Primary Care Provider  Learn more about [facility]'s in-office and virtual care options: [facility] Get Care Now

## 2023-09-24 NOTE — Telephone Encounter (Signed)
 I have done advised pt to make appt for this earlier today

## 2023-09-24 NOTE — Telephone Encounter (Signed)
 Copied from CRM [id number]. Topic: Clinical - Medical Advice >> Sep 23, 2023  4:55 PM [place] J wrote: Reason for CRM: Patient's husband was tested positive for strongyloides and she wants to know if it would be best to be checked as well. She also wants to know if her kids need to be checked and treated as well.  The patient states that she's been having brain fog, stomach issues, and odd bowel movements.   Her children have been having stomach issues such as constipation, nausea, and indigestion. Their behavioral issues have also changed. Patient did state that both children are autistic and this is very abnormal for them.  Please review and advise

## 2023-09-26 ENCOUNTER — Ambulatory Visit: Admitting: Family Medicine

## 2023-09-27 ENCOUNTER — Ambulatory Visit: Admitting: Family Medicine

## 2023-09-29 ENCOUNTER — Ambulatory Visit: Payer: Self-pay | Admitting: Internal Medicine

## 2023-09-29 NOTE — Progress Notes (Signed)
 Good news: there is no complications or evidence of new problems.  There is evidence of some traumatic brain injury and the surgery for that but no complications.  This supports but doesn't confirm a diagnosis of Attention Deficit Hyperactivity Disorder (ADHD) secondary to traumatic brain injury.

## 2023-09-30 ENCOUNTER — Ambulatory Visit (INDEPENDENT_AMBULATORY_CARE_PROVIDER_SITE_OTHER): Admitting: Internal Medicine

## 2023-09-30 ENCOUNTER — Encounter: Payer: Self-pay | Admitting: Internal Medicine

## 2023-09-30 VITALS — BP 110/70 | HR 92 | Temp 98.3°F | Ht 67.0 in | Wt 128.8 lb

## 2023-09-30 DIAGNOSIS — B789 Strongyloidiasis, unspecified: Secondary | ICD-10-CM | POA: Diagnosis not present

## 2023-09-30 DIAGNOSIS — R5383 Other fatigue: Secondary | ICD-10-CM | POA: Diagnosis not present

## 2023-09-30 DIAGNOSIS — H9201 Otalgia, right ear: Secondary | ICD-10-CM

## 2023-09-30 DIAGNOSIS — L700 Acne vulgaris: Secondary | ICD-10-CM | POA: Diagnosis not present

## 2023-09-30 DIAGNOSIS — R634 Abnormal weight loss: Secondary | ICD-10-CM | POA: Diagnosis not present

## 2023-09-30 DIAGNOSIS — R2990 Unspecified symptoms and signs involving the nervous system: Secondary | ICD-10-CM

## 2023-09-30 MED ORDER — IVERMECTIN 3 MG PO TABS
12.0000 mg | ORAL_TABLET | Freq: Every day | ORAL | 0 refills | Status: DC
  Filled 2023-10-07: qty 8, 2d supply, fill #0

## 2023-09-30 NOTE — Patient Instructions (Signed)
 VISIT SUMMARY:  Today, we discussed your symptoms, including unusual stools, menstrual irregularities, cystic acne, new onset stuttering, episodic paralysis, and ear pain. We also reviewed your significant weight loss and your husband's recent treatment for a parasitic infection. Based on your symptoms and history, we have initiated treatment and ordered several tests to better understand your condition.  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 We have started you on ivermectin  12 mg daily for two days, to be repeated in two weeks, and ordered tests including a strongyloidiasis IgG test, multiple stool samples, and a CBC with differential. We will also consult an infectious disease specialist.  -EPISODIC PARALYSIS: Episodic paralysis can be a temporary loss of muscle function, possibly related to seizures. We recommend a neurological evaluation and have referred you to a new neurologist for further assessment and an EEG. Please avoid driving until we have more information.  -CYSTIC ACNE: Cystic acne is a severe form of acne that can be painful and persistent. We have ordered a hormone panel to check for any imbalances that might be contributing to this issue.  -WEIGHT LOSS: Unintentional weight loss can be a sign of nutritional deficiencies or other underlying conditions. We have ordered a metabolic panel, celiac panel, and nutritional testing to investigate further.  -EAR PAIN: Intermittent sharp ear pain could be due to chronic infections or neurological issues related to your TBI. We have referred you to an ENT specialist for further evaluation.  -TRAUMATIC BRAIN INJURY (TBI) WITH ENCEPHALOMALACIA: Your MRI shows significant brain damage, which may impact your symptoms and overall health. We support your full disability claim and suggest discussing potential legal  assistance.  -GENERAL HEALTH MAINTENANCE: Your non-pregnancy status has been confirmed, and there are no contraindications for your ivermectin  treatment related to pregnancy or lactation.  INSTRUCTIONS:  Please follow up in two weeks to assess your response to the ivermectin  treatment and discuss further management options. We will also review the results of your tests and any additional treatments that may be necessary.
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
## 2023-10-02 ENCOUNTER — Ambulatory Visit: Payer: Self-pay | Admitting: Internal Medicine

## 2023-10-02 DIAGNOSIS — B789 Strongyloidiasis, unspecified: Secondary | ICD-10-CM | POA: Diagnosis not present

## 2023-10-02 LAB — HOMOCYSTEINE: Homocysteine: 8.6 umol/L (ref <=11.0)

## 2023-10-02 NOTE — Progress Notes (Signed)
 Hormone panel pending, strongyloides negative

## 2023-10-03 LAB — B12 AND FOLATE PANEL
Folate: 15.8 ng/mL
Vitamin B-12: 480 pg/mL (ref 211–911)

## 2023-10-03 NOTE — Progress Notes (Signed)
 Homocystine is a marker of nutritional status indicating that you are doing okay on vitamins this was a normal test

## 2023-10-04 ENCOUNTER — Telehealth: Payer: Self-pay

## 2023-10-04 ENCOUNTER — Other Ambulatory Visit (HOSPITAL_COMMUNITY): Payer: Self-pay

## 2023-10-04 NOTE — Telephone Encounter (Signed)
 Pharmacy Patient Advocate Encounter  Received notification from [employer] that Prior Authorization for Ivermectin  3MG  tablets has been APPROVED from 10/04/23 to 11/03/2023. Ran test claim, Copay is $4.00. This test claim was processed through [facility]- copay amounts may vary at other pharmacies due to pharmacy/plan contracts, or as the patient moves through the different stages of their insurance plan.   PA #/Case ID/Reference #: [id number]

## 2023-10-04 NOTE — Telephone Encounter (Signed)
 Pharmacy Patient Advocate Encounter   Received notification from Onbase that prior authorization for Ivermectin  3MG  tablets is required/requested.   Insurance verification completed.   The patient is insured through [facility] .   Per test claim: PA required; PA submitted to above mentioned insurance via CoverMyMeds Key/confirmation #/EOC [id number] Status is pending   -Can you confirm if the patient still needs PA for medication Ivermectin ? (I see the negavite result for stronyloides)

## 2023-10-04 NOTE — Telephone Encounter (Signed)
 Yes she still needs

## 2023-10-04 NOTE — Telephone Encounter (Signed)
 PA SUBMITTED AND PENDING.  WUJ:[id number]

## 2023-10-05 DIAGNOSIS — F4312 Post-traumatic stress disorder, chronic: Secondary | ICD-10-CM | POA: Diagnosis not present

## 2023-10-05 DIAGNOSIS — F331 Major depressive disorder, recurrent, moderate: Secondary | ICD-10-CM | POA: Diagnosis not present

## 2023-10-05 DIAGNOSIS — F900 Attention-deficit hyperactivity disorder, predominantly inattentive type: Secondary | ICD-10-CM | POA: Diagnosis not present

## 2023-10-06 NOTE — Progress Notes (Signed)
 These test were essentially normal although the alkaline phos was reported as low that is completely irrelevant

## 2023-10-07 ENCOUNTER — Ambulatory Visit: Admitting: Internal Medicine

## 2023-10-07 ENCOUNTER — Telehealth: Payer: Self-pay | Admitting: Internal Medicine

## 2023-10-07 ENCOUNTER — Other Ambulatory Visit (HOSPITAL_BASED_OUTPATIENT_CLINIC_OR_DEPARTMENT_OTHER): Payer: Self-pay

## 2023-10-07 ENCOUNTER — Other Ambulatory Visit: Payer: Self-pay

## 2023-10-07 NOTE — Telephone Encounter (Signed)
 Pt's spouse is requesting neurology referral be upgraded to urgent. Please advise.

## 2023-10-09 ENCOUNTER — Other Ambulatory Visit (HOSPITAL_BASED_OUTPATIENT_CLINIC_OR_DEPARTMENT_OTHER): Payer: Self-pay

## 2023-10-10 ENCOUNTER — Encounter: Payer: Self-pay | Admitting: Internal Medicine

## 2023-10-10 ENCOUNTER — Ambulatory Visit: Payer: Self-pay | Admitting: Internal Medicine

## 2023-10-10 LAB — STRONGYLOIDES, AB, IGG: Strongyloides, Ab, IgG: NEGATIVE

## 2023-10-10 LAB — HORMONE PANEL (T4,TSH,FSH,TESTT,SHBG,DHEA,ETC)
DHEA-Sulfate, LCMS: 140 ug/dL
Estradiol, Serum, MS: 33 pg/mL
Estrone Sulfate: <20 ng/dL
Follicle Stimulating Hormone: 11 m[IU]/mL
Free T-3: 3.3 pg/mL
Free Testosterone, Serum: 1.7 pg/mL
Progesterone, Serum: <20 ng/dL
Sex Hormone Binding Globulin: 85.5 nmol/L
T4: 7.4 ug/dL
TSH: 3 uU/mL
Testosterone, Serum (Total): 17 ng/dL
Testosterone-% Free: 1 %
Triiodothyronine (T-3), Serum: 104 ng/dL

## 2023-10-10 LAB — LAB REPORT - SCANNED: TSH: 3 (ref 0.41–5.90)

## 2023-10-16 ENCOUNTER — Ambulatory Visit (HOSPITAL_COMMUNITY): Admitting: Mental Health

## 2023-10-16 ENCOUNTER — Encounter

## 2023-10-16 ENCOUNTER — Telehealth (HOSPITAL_COMMUNITY): Payer: Self-pay | Admitting: Mental Health

## 2023-10-16 DIAGNOSIS — F4310 Post-traumatic stress disorder, unspecified: Secondary | ICD-10-CM

## 2023-10-16 NOTE — BH Specialist Note (Signed)
 Therapist contacted pt to connect to tele-therapy session. Pt reported to currently be at work and for break to be over in x 15 minutes and unable to hold session. Rescheduled for 8/25 @3pm  virtual

## 2023-10-17 NOTE — Progress Notes (Signed)
 Left without being seen (at work at time of session) Rescheduled 8/25 @ 3pm

## 2023-10-18 ENCOUNTER — Other Ambulatory Visit (HOSPITAL_BASED_OUTPATIENT_CLINIC_OR_DEPARTMENT_OTHER): Payer: Self-pay

## 2023-10-18 ENCOUNTER — Telehealth (INDEPENDENT_AMBULATORY_CARE_PROVIDER_SITE_OTHER): Admitting: Internal Medicine

## 2023-10-18 ENCOUNTER — Other Ambulatory Visit: Payer: Self-pay | Admitting: Internal Medicine

## 2023-10-18 VITALS — Ht 67.0 in | Wt 128.0 lb

## 2023-10-18 DIAGNOSIS — B789 Strongyloidiasis, unspecified: Secondary | ICD-10-CM

## 2023-10-18 DIAGNOSIS — B889 Infestation, unspecified: Secondary | ICD-10-CM

## 2023-10-18 MED ORDER — IVERMECTIN 6 MG PO TABS: 2.0000 | ORAL_TABLET | Freq: Every day | ORAL | 0 refills | Status: DC

## 2023-10-18 MED ORDER — IVERMECTIN 1 % EX CREA: 1.0000 | TOPICAL_CREAM | Freq: Every day | CUTANEOUS | 1 refills | Status: DC

## 2023-10-18 NOTE — Progress Notes (Unsigned)
 ====================================  [facility]: [phone]   --  Virtual Video Medical Office Visit --  Patient: Paula Burch      Age: 28 y.o.       Sex:  female  Date:   10/18/2023 Today's Healthcare Provider: Bernardino KANDICE Cone, MD  ====================================    Chief Complaint/Reason For Visit: Follow-up Request retreatment with ivermectin . Chart reviewed: has Headache; History of traumatic brain injury; Antepartum tobacco use less than before pregnancy; H/O macrosomia in infant in prior pregnancy, currently pregnant, first trimester; History of delivery of macrosomal infant; History of spinal surgery; Pap smear of cervix with ASCUS, cannot exclude HGSIL; PTSD (post-traumatic stress disorder); Irritability; Low BP; Other fatigue; Otitis of both ears; Psychiatric disturbance; Vitamin D  deficiency; Concentration deficit; History of compression fracture of spine; and Anosmia on their problem list..  Chart reviewed:  has a past medical history of Anxiety, Depression, Full-term PROM with onset of labor within 24 hours of rupture (04/11/2021), History of shoulder dystocia in prior pregnancy (08/04/2020), Irritability (07/06/2022), Marijuana use (08/14/2020), Smoker (08/14/2020), Supervision of high-risk pregnancy (11/14/2020), and Vaginal bleeding in pregnancy (08/14/2020). Discussed the use of AI scribe software for clinical note transcription with the patient, who gave verbal consent to proceed.  History of Present Illness Paula Burch is a 28 year old female who presents with a request for a second round of ivermectin  treatment following symptomatic improvement after the first course.  She experienced significant symptomatic improvement after taking the first round of ivermectin  two weeks ago. Prior to treatment, she had severe dandruff, weak nails, skin issues, and a 'horn' on her foot. Post-treatment, her head stopped itching,  her nails are stronger, her skin is clearing up, and the 'horn' on her foot is going away.  She also reports gastrointestinal improvements, including more regular bowel movements, reduced bloating, and less frequent constipation. Additionally, she mentions a reduction in cystic breakouts, with only one occurrence near her nose since the treatment, whereas previously she experienced frequent breakouts on her face and neck.  She has been dealing with these symptoms for years and attributes the improvements to the ivermectin  treatment.   Medications reviewed Current Outpatient Medications on File Prior to Visit  Medication Sig   albendazole  (ALBENZA ) 200 MG tablet Take 400 mg by mouth once.   amphetamine-dextroamphetamine (ADDERALL XR) 10 MG 24 hr capsule Take 10 mg by mouth.   amphetamine-dextroamphetamine (ADDERALL) 10 MG tablet Take 10 mg by mouth 2 (two) times daily as needed.   amphetamine-dextroamphetamine (ADDERALL) 20 MG tablet Take 20 mg by mouth 2 (two) times daily.   ibuprofen (ADVIL) 800 MG tablet Take 800 mg by mouth every 6 (six) hours as needed.   Multiple Vitamins-Minerals (MULTIVITAMIN WITH MINERALS) tablet Take 1 tablet by mouth daily.   Omega-3 Fatty Acids (FISH OIL ) 1000 MG CAPS Take 2 capsules (2,000 mg total) by mouth 2 (two) times daily.   VITAMIN D  PO Take by mouth daily.   ivermectin  (STROMECTOL ) 3 MG TABS tablet Take 4 tablets (12 mg total) by mouth daily for 2 days.   No current facility-administered medications on file prior to visit.  There are no discontinued medications.      Virtual Physical Exam Physical Exam  Exam Context: Evaluation limited by virtual format; however, patient is clearly visualized, cooperative, and engaged throughout. General Appearance: Well-developed, well-nourished; no acute distress by limited video assessment. Pulmonary: No respiratory distress apparent; normal work of breathing observed. Neurological: Patient is awake,  alert, and  demonstrates no obvious focal neurological deficits or cognitive impairments; sensorium appears unclouded. Psychiatric/Mental Status: Mood is appropriate; demeanor is pleasant, calm, and articulate. Speech is coherent and goal-directed with no evidence of slurred or pressured speech. No abnormal psychomotor activity noted. (Lack of) Substance Misuse Indicators: Pupils appear symmetric and reactive as far as can be assessed via video. No track marks, skin lesions, or other stigmata of substance misuse visible. No signs of intoxication or withdrawal are evident.         No results found for any visits on 10/18/23. Scanned Document on 10/10/2023  Component Date Value   TSH 09/30/2023 3.00   Office Visit on 09/30/2023  Component Date Value   Strongyloides, Ab, IgG 09/30/2023 Negative    MICRO NUMBER: 10/02/2023 [id number]    SPECIMEN QUALITY: 10/02/2023 Adequate    Source 10/02/2023 STOOL    STATUS: 10/02/2023 FINAL    CONCENTRATE RESULT: 10/02/2023 No ova or parasites seen    TRICHROME RESULT: 10/02/2023 No ova or parasites seen    COMMENT: 10/02/2023                     Value:Routine Ova and Parasite exam may not detect some parasites that occasionally cause diarrheal illness. Cryptosporidium Antigen and/or Cyclospora and Isospora Exam may be ordered to detect these parasites. One negative sample does not necessarily rule out  the presence of a parasitic infection.  For additional information, please refer to https://education.quest[employer].com/faq/FAQ203 (This link is being provided for informational/ educational purposes only.)    Sodium 09/30/2023 139    Potassium 09/30/2023 4.0    Chloride 09/30/2023 101    CO2 09/30/2023 30    Glucose, Bld 09/30/2023 90    BUN 09/30/2023 12    Creatinine, Ser 09/30/2023 0.82    Total Bilirubin 09/30/2023 0.6    Alkaline Phosphatase 09/30/2023 38 (L)    AST 09/30/2023 14    ALT 09/30/2023 12    Total Protein 09/30/2023 7.5    Albumin  09/30/2023 4.8    GFR 09/30/2023 96.12    Calcium 09/30/2023 9.7    TSH 09/30/2023 2.86    TSH 09/30/2023 3.0    T4 09/30/2023 7.4    Free T-3 09/30/2023 3.3    Triiodothyronine (T-3), * 09/30/2023 104    Testosterone, Serum (Tot* 09/30/2023 17    Free Testosterone, Serum 09/30/2023 1.7    Testosterone-% Free 09/30/2023 1.0    Follicle Stimulating Hor* 09/30/2023 11    Progesterone , Serum 09/30/2023 <20    DHEA-Sulfate, LCMS 09/30/2023 140    Sex Hormone Binding Glob* 09/30/2023 85.5    Estrone Sulfate 09/30/2023 <20    Estradiol, Serum, MS 09/30/2023 33    Homocysteine 09/30/2023 8.6    Vitamin B-12 09/30/2023 480    Folate 09/30/2023 15.8   Appointment on 03/18/2023  Component Date Value   TSH W/REFLEX TO FT4 03/18/2023 1.52    Cholesterol 03/18/2023 158    HDL 03/18/2023 53    Triglycerides 03/18/2023 66    LDL Cholesterol (Calc) 03/18/2023 90    Total CHOL/HDL Ratio 03/18/2023 3.0    Non-HDL Cholesterol (Cal* 03/18/2023 105    Cholesterol 03/18/2023 150    Triglycerides 03/18/2023 64.0    HDL 03/18/2023 42.20    VLDL 03/18/2023 12.8    LDL Cholesterol 03/18/2023 95    Total CHOL/HDL Ratio 03/18/2023 4    NonHDL 03/18/2023 107.81    Iron 03/18/2023 57    TIBC 03/18/2023 319    %  SAT 03/18/2023 18    Ferritin 03/18/2023 33    Cortisol, Plasma 03/18/2023 8.4    Sodium 03/18/2023 141    Potassium 03/18/2023 4.0    Chloride 03/18/2023 105    CO2 03/18/2023 26    Glucose, Bld 03/18/2023 90    BUN 03/18/2023 14    Creatinine, Ser 03/18/2023 0.86    Total Bilirubin 03/18/2023 0.6    Alkaline Phosphatase 03/18/2023 45    AST 03/18/2023 11    ALT 03/18/2023 11    Total Protein 03/18/2023 6.7    Albumin 03/18/2023 4.6    GFR 03/18/2023 91.12    Calcium 03/18/2023 9.7    WBC 03/18/2023 5.9    RBC 03/18/2023 4.61    Hemoglobin 03/18/2023 14.0    HCT 03/18/2023 41.2    MCV 03/18/2023 89.3    MCHC 03/18/2023 33.9    RDW 03/18/2023 13.2    Platelets 03/18/2023 344.0     Neutrophils Relative % 03/18/2023 57.9    Lymphocytes Relative 03/18/2023 34.1    Monocytes Relative 03/18/2023 4.6    Eosinophils Relative 03/18/2023 3.0    Basophils Relative 03/18/2023 0.4    Neutro Abs 03/18/2023 3.4    Lymphs Abs 03/18/2023 2.0    Monocytes Absolute 03/18/2023 0.3    Eosinophils Absolute 03/18/2023 0.2    Basophils Absolute 03/18/2023 0.0    CRP 03/18/2023 <1.0    Albumin 03/18/2023 4.6    Dehydroepiandrosterone 03/18/2023 339    LH 03/18/2023 5.2    FSH 03/18/2023 6.5    Prolactin 03/18/2023 4.7 (L)    Progesterone  03/18/2023 0.4    Estrogen 03/18/2023 57    Sex Hormone Binding 03/18/2023 68.9    Vit D, 25-Hydroxy 03/18/2023 48.6   No image results found. MR Brain W Wo Contrast Result Date: 09/29/2023 EXAMINATION: MR BRAIN W WO CONTRAST HISTORY: Traumatic brain injury (TBI), new or progressive neuro deficits; Mental status change, unknown cause; Headache, neuro deficit; worsening cognitive status, brain fog, history of tbi, severeheadache TECHNIQUE: MRI of the brain performed with and without IV contrast. CONTRAST: 6 cc Vueway  IV COMPARISON: None FINDINGS: No evidence for intracranial mass, hemorrhage, or acute infarct. 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 There is encephalomalacia in the anterior left frontal lobe as well as the bilateral orbital frontal lobes, anterior bilateral temporal lobes, and posterior left occipital lobe. The ventricles are symmetric and the basilar cisterns are patent. The paranasal sinuses and mastoid air cells are well aerated. The orbits appear normal. No abnormal enhancement is appreciated following the administration of intravenous contrast material. 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 Electronically signed by: Italy Engel MD 09/29/2023 04:33 PM EDT RP Workstation: [id number]  MR Brain W Wo Contrast Result Date:  09/29/2023 EXAMINATION: MR BRAIN W WO CONTRAST HISTORY: Traumatic brain injury (TBI), new or progressive neuro deficits; Mental status change, unknown cause; Headache, neuro deficit; worsening cognitive status, brain fog, history of tbi, severeheadache TECHNIQUE: MRI of the brain performed with and without IV contrast. CONTRAST: 6 cc Vueway  IV COMPARISON: None FINDINGS: No evidence for intracranial mass, hemorrhage, or acute infarct. 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 There is encephalomalacia in the anterior left frontal lobe as well as the bilateral orbital frontal lobes, anterior bilateral temporal lobes, and posterior left occipital lobe. The ventricles are symmetric and the basilar cisterns are patent. The paranasal sinuses and mastoid air cells are well aerated. The orbits appear normal.  No abnormal enhancement is appreciated following the administration of intravenous contrast material. 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 Despite the absence of a positive test, her long-standing symptoms have improved with treatment. A second round of ivermectin  is recommended by some physicians to address residual larvae and reduce reinfection risk. Given her improvement and potential residual infection, a second course is justified. Prescribe a second two-day course of ivermectin . Infestation by mites Also suspicious for mites given the improvement of hair nails and skin. Gave advice on mite avoidance and prevention and  ivermectin  cream       Orders Placed During this Encounter:  No orders of the defined types were placed in this encounter.  Meds ordered this encounter  Medications   Ivermectin  6 MG TABS    Sig: Take 2 tablets (12 mg total) by mouth daily at 6 (six) AM.    Dispense:  4 each    Refill:  0   Ivermectin  1 % CREA    Sig: Apply 1 tablet topically daily at 6 (six) AM.    Dispense:  90 g    Refill:  1    Treatment plan discussed and reviewed in detail. Explained medication safety and potential side effects.  Answered all patient questions and confirmed understanding and comfort with the plan. Encouraged patient to contact our office if they have any questions or concerns.  Agreed on patient coming for a sooner office visit if symptoms worsen, persist, or new symptoms develop. Discussed precautions in case of needing to visit the Emergency Department.    ----------------------------------------------------- Attestation:  Today's Healthcare Provider Bernardino KANDICE Cone, MD was located at office at [facility] [address], [place] [address].  The patient was located at home. All video encounter participant identities and locations confirmed visually and verbally.Today's Telemedicine visit was conducted via synchronous Video after consent for telemedicine was obtained:  Video connection was never lost    This document was transcribed and resynthesized, in part, by artificial intelligence (Abridge) using HIPAA-compliant recording of the clinical interaction;   We have discussed the our use of AI scribe software for clinical note transcription with the patient, who has given verbal consent to proceed.

## 2023-10-19 DIAGNOSIS — F331 Major depressive disorder, recurrent, moderate: Secondary | ICD-10-CM | POA: Diagnosis not present

## 2023-10-19 DIAGNOSIS — F4312 Post-traumatic stress disorder, chronic: Secondary | ICD-10-CM | POA: Diagnosis not present

## 2023-10-19 DIAGNOSIS — F900 Attention-deficit hyperactivity disorder, predominantly inattentive type: Secondary | ICD-10-CM | POA: Diagnosis not present

## 2023-10-22 ENCOUNTER — Telehealth: Payer: Self-pay

## 2023-10-22 NOTE — Telephone Encounter (Signed)
 Review and advise where else you would like for me to send the referral thanks  Copied from CRM [id number]. Topic: General - Other >> Oct 22, 2023  3:16 PM Thersia C wrote: Reason for CRM: Rollene from [facility] called regarding referral , stated they will not be treating patient

## 2023-10-24 DIAGNOSIS — H5213 Myopia, bilateral: Secondary | ICD-10-CM | POA: Diagnosis not present

## 2023-10-28 ENCOUNTER — Other Ambulatory Visit (HOSPITAL_COMMUNITY): Payer: Self-pay

## 2023-10-28 ENCOUNTER — Telehealth: Payer: Self-pay

## 2023-10-28 DIAGNOSIS — B86 Scabies: Secondary | ICD-10-CM

## 2023-10-28 MED ORDER — PERMETHRIN 5 % EX CREA: 1.0000 | TOPICAL_CREAM | Freq: Every day | CUTANEOUS | 5 refills | Status: DC

## 2023-10-28 NOTE — Telephone Encounter (Signed)
 Sent to pa team to try to get approved also message pt via my chart  Copied from CRM [id number]. Topic: Clinical - Prescription Issue >> Oct 23, 2023  4:26 PM Thersia BROCKS wrote: Reason for CRM: Patient called in regarding prescription, Ivermectin  1 % CREA wanted to know what the status of the PA so she can go get that from the pharmacy, would like for someone to give her a callback

## 2023-10-28 NOTE — Telephone Encounter (Signed)
 Pharmacy Patient Advocate Encounter   Received notification from Pt Calls Messages that prior authorization for IVERMECTIN  1% CREAM is required/requested.   Insurance verification completed.   The patient is insured through [facility] .   Per test claim:  SEE LIST BELOW is preferred by the insurance.  If suggested medication is appropriate, Please send in a new RX and discontinue this one. If not, please advise as to why it's not appropriate so that we may request a Prior Authorization. Please note, some preferred medications may still require a PA.  If the suggested medications have not been trialed and there are no contraindications to their use, the PA will not be submitted, as it will not be approved.   Patient must try and fail two of the preferred alternatives.SABRA

## 2023-11-06 ENCOUNTER — Ambulatory Visit: Payer: Self-pay

## 2023-11-06 DIAGNOSIS — B789 Strongyloidiasis, unspecified: Secondary | ICD-10-CM

## 2023-11-06 NOTE — Telephone Encounter (Signed)
 FYI Only or Action Required?: FYI only for provider.  Patient was last seen in primary care on 10/18/2023 by Jesus Bernardino MATSU, MD.  Called Nurse Triage reporting Abdominal Pain.  Symptoms began several months ago.  Interventions attempted: Rest, hydration, or home remedies.  Symptoms are: stable.  Triage Disposition: See PCP Within 2 Weeks  Patient/caregiver understands and will follow disposition?: Yes    Copied from CRM [id number]. Topic: Clinical - Red Word Triage >> Nov 06, 2023  4:29 PM Lavanda D wrote: Red Word that prompted transfer to Nurse Triage: Patients husband has a positive test back for Strongyloides and Mrs. Breault would like to be further tested if necessary, Weird skin issues/weird abdominal pain in lower abdomen and liver area/excessive tiredness/poor sleep/constipation.   ----------------------------------------------------------------------- From previous Reason for Contact - Scheduling: Patient/patient representative is calling to schedule an appointment. Refer to attachments for appointment information. Reason for Disposition  Abdominal pain is a chronic symptom (recurrent or ongoing AND present > 4 weeks)  Answer Assessment - Initial Assessment Questions 1. LOCATION: Where does it hurt?      In the liver area and lower abdomen  2. RADIATION: Does the pain shoot anywhere else? (e.g., chest, back)     No  3. ONSET: When did the pain begin? (e.g., minutes, hours or days ago)      Over a month  4. SUDDEN: Gradual or sudden onset?     Gradual  5. PATTERN Does the pain come and go, or is it constant?     Comes and goes  6. SEVERITY: How bad is the pain?  (e.g., Scale 1-10; mild, moderate, or severe)     Mild pain  7. RECURRENT SYMPTOM: Have you ever had this type of stomach pain before? If Yes, ask: When was the last time? and What happened that time?      Yes, tested for Strongyloides  8. CAUSE: What do you think is causing the  stomach pain? (e.g., gallstones, recent abdominal surgery)     Recent eposure to husband that tested positive for strongyloides  9. RELIEVING/AGGRAVATING FACTORS: What makes it better or worse? (e.g., antacids, bending or twisting motion, bowel movement)     No  10. OTHER SYMPTOMS: Do you have any other symptoms? (e.g., back pain, diarrhea, fever, urination pain, vomiting)       Excessive tiredness, not sleeping well, and constipation, and skin issues  11. PREGNANCY: Is there any chance you are pregnant? When was your last menstrual period?       October 28, 2023  Protocols used: Abdominal Pain - [facility]

## 2023-11-07 NOTE — Addendum Note (Signed)
 Addended by: Elenna Spratling G on: 11/07/2023 03:22 PM   Modules accepted: Orders

## 2023-11-09 DIAGNOSIS — F331 Major depressive disorder, recurrent, moderate: Secondary | ICD-10-CM | POA: Diagnosis not present

## 2023-11-09 DIAGNOSIS — F4312 Post-traumatic stress disorder, chronic: Secondary | ICD-10-CM | POA: Diagnosis not present

## 2023-11-09 DIAGNOSIS — F900 Attention-deficit hyperactivity disorder, predominantly inattentive type: Secondary | ICD-10-CM | POA: Diagnosis not present

## 2023-11-13 ENCOUNTER — Ambulatory Visit: Admitting: Internal Medicine

## 2023-11-13 ENCOUNTER — Other Ambulatory Visit

## 2023-11-13 ENCOUNTER — Ambulatory Visit (INDEPENDENT_AMBULATORY_CARE_PROVIDER_SITE_OTHER): Admitting: Family

## 2023-11-13 ENCOUNTER — Encounter: Payer: Self-pay | Admitting: Family

## 2023-11-13 VITALS — BP 125/84 | HR 100 | Temp 97.5°F | Ht 67.0 in | Wt 126.1 lb

## 2023-11-13 DIAGNOSIS — R519 Headache, unspecified: Secondary | ICD-10-CM | POA: Diagnosis not present

## 2023-11-13 DIAGNOSIS — B789 Strongyloidiasis, unspecified: Secondary | ICD-10-CM | POA: Diagnosis not present

## 2023-11-13 DIAGNOSIS — R1011 Right upper quadrant pain: Secondary | ICD-10-CM

## 2023-11-13 MED ORDER — CYCLOBENZAPRINE HCL 5 MG PO TABS: 5.0000 mg | ORAL_TABLET | Freq: Three times a day (TID) | ORAL | 0 refills | Status: DC | PRN

## 2023-11-13 MED ORDER — KETOROLAC TROMETHAMINE 60 MG/2ML IM SOLN
60.0000 mg | Freq: Once | INTRAMUSCULAR | Status: AC
  Administered 2023-11-13: 60 mg via INTRAMUSCULAR

## 2023-11-13 NOTE — Progress Notes (Signed)
 Patient ID: Paula Burch, female    DOB: [birth year], 28 y.o.   MRN: [id number]  Chief Complaint  Patient presents with   Abdominal Pain    Pt c/o abdominal pain, present since abx.    Migraine    Pt c/o migraines, present for 1 week. Has tried ibuprofen,tylenol  and aspirin which did not help sx.   Discussed the use of AI scribe software for clinical note transcription with the patient, who gave verbal consent to proceed.  History of Present Illness Paula Burch is a 28 year old female who presents with persistent stomach pain and headaches.  Abdominal pain - Persistent pain on right side located under the ribs, described as throbbing with a dull ache - Pain intensity rated at 4 out of 10 - Onset after taking ivermectin  - Occurs randomly throughout the day, typically after activity - No improvement with eating, drinking, or bowel movements - Initially associated with constipation, now daily soft bowel movements without straining - No associated nausea, vomiting, fever, acid reflux, or heartburn - No current gas pain or bloating  Cephalgia (headache) - History of TBI and chronic headaches - Headaches have increased in intensity and duration over the past week - Pain extends from above the eyebrows to the top of the head - Temporary loss of vision when looking down, resolving after blinking - Partial relief with ibuprofen 800mg , Tylenol , and aspirin - Fioricet was effective in the past, but not currently used regularly - No prior use of muscle relaxers - Has a prescription for ibuprofen 800mg  for headache management  Anxiety symptoms - Started Buspar on the day of the visit for anxiety management  Assessment & Plan Headache Severe headaches with transient vision loss, history of traumatic brain injury and focal seizures. Previous medications provided partial relief, Fioricet effective a long time ago. Cyclobenzaprine  considered for headache and abdominal pain  relief. - Administer Toradol  60mg  injection for acute headache relief. - Prescribe cyclobenzaprine  5 mg-10mg  tid prn, caution about drowsiness, can take 2 pills at bedtime. - Advise against ibuprofen on Toradol  administration day. - Discussed cyclobenzaprine  use in the evening or when not driving.  Abdominal Pain Intermittent right upper quadrant pain post-ivermectin , rates 4/10 on pain scale, no associated gastrointestinal symptoms. Differential includes residual antiparasitic effects or other GI issues. - Order abdominal ultrasound. - Continue normal diet - Advise monitoring for worsening symptoms such as diarrhea or constipation.  Follow-up Follow-up needed for ultrasound results and management of abdominal pain and headaches. - Schedule ultrasound and notify of results. - Advise to contact clinic if symptoms worsen. - Follow-up with PCP if needed.     Subjective:    Outpatient Medications Prior to Visit  Medication Sig Dispense Refill   amphetamine-dextroamphetamine (ADDERALL XR) 10 MG 24 hr capsule Take 10 mg by mouth.     amphetamine-dextroamphetamine (ADDERALL) 10 MG tablet Take 10 mg by mouth 2 (two) times daily as needed.     amphetamine-dextroamphetamine (ADDERALL) 20 MG tablet Take 20 mg by mouth 2 (two) times daily.     busPIRone (BUSPAR) 5 MG tablet Take 5 mg by mouth 2 (two) times daily.     ibuprofen (ADVIL) 800 MG tablet Take 800 mg by mouth every 6 (six) hours as needed.     Multiple Vitamins-Minerals (MULTIVITAMIN WITH MINERALS) tablet Take 1 tablet by mouth daily. 90 tablet 3   Omega-3 Fatty Acids (FISH OIL ) 1000 MG CAPS Take 2 capsules (2,000 mg total) by mouth 2 (two) times  daily. 90 capsule 3   albendazole  (ALBENZA ) 200 MG tablet Take 400 mg by mouth once. (Patient not taking: Reported on 11/13/2023)     ivermectin  (STROMECTOL ) 3 MG TABS tablet Take 4 tablets (12 mg total) by mouth daily for 2 days. (Patient not taking: Reported on 11/13/2023) 8 tablet 0    Ivermectin  1 % CREA APPLY TOPICALLY DAILY AT 6 (SIX) AM. (Patient not taking: Reported on 11/13/2023) 90 g 1   Ivermectin  6 MG TABS Take 2 tablets (12 mg total) by mouth daily at 6 (six) AM. (Patient not taking: Reported on 11/13/2023) 4 each 0   permethrin  (ELIMITE ) 5 % cream Apply 1 Application topically daily at 6 (six) AM. Apply a thin layer from neck down to entire body, including between fingers and toes, under nails, and skin folds. Leave on 8-14 hours, then wash off. Repeat in 1 week if needed. 60 g 5   VITAMIN D  PO Take by mouth daily.     No facility-administered medications prior to visit.   Past Medical History:  Diagnosis Date   Anxiety    Depression    Full-term PROM with onset of labor within 24 hours of rupture 04/11/2021   History of shoulder dystocia in prior pregnancy 08/04/2020   Irritability 07/06/2022   Could be brain injury, genetic or situational.   Marijuana use 08/14/2020   Formatting of this note might be different from the original.  08/11/20 Quit two weeks ago.   Smoker 08/14/2020   Formatting of this note might be different from the original.  08/12/20 Quitting. Down to 5 cig/day from 1/2 PPD.   Supervision of high-risk pregnancy 11/14/2020   Vaginal bleeding in pregnancy 08/14/2020   Formatting of this note might be different from the original.  09/05/20: Could not check progesterone  b/c patient took prog last night  Recheck progesterone  with 10wk labs  08/11/20 Prog 11.8 @ ~6w, Plan Prog 200mg  PO/PV q HS     LMP 06/29/20; +HPT 08/01/20   Past Surgical History:  Procedure Laterality Date   BACK SURGERY     JOINT REPLACEMENT  11/2014   SPINE SURGERY  11/2014   No Known Allergies    Objective:    Physical Exam Vitals and nursing note reviewed.  Constitutional:      Appearance: Normal appearance.  Cardiovascular:     Rate and Rhythm: Normal rate and regular rhythm.  Pulmonary:     Effort: Pulmonary effort is normal.     Breath sounds: Normal breath sounds.   Abdominal:     General: There is no distension.     Palpations: There is no hepatomegaly or mass.     Tenderness: There is abdominal tenderness in the right upper quadrant. There is no guarding or rebound. Negative signs include Murphy's sign and McBurney's sign.  Musculoskeletal:        General: Normal range of motion.  Skin:    General: Skin is warm and dry.  Neurological:     Mental Status: She is alert.  Psychiatric:        Mood and Affect: Mood normal.        Behavior: Behavior normal.    BP 125/84 (BP Location: Left Arm, Patient Position: Sitting, Cuff Size: Normal)   Pulse 100   Temp (!) 97.5 F (36.4 C) (Temporal)   Ht 5' 7 (1.702 m)   Wt 126 lb 2 oz (57.2 kg)   LMP 09/28/2023 (Exact Date)   SpO2 98%   Breastfeeding  No   BMI 19.75 kg/m  Wt Readings from Last 3 Encounters:  11/13/23 126 lb 2 oz (57.2 kg)  10/18/23 128 lb (58.1 kg)  09/30/23 128 lb 12.8 oz (58.4 kg)      Lucius Krabbe, NP

## 2023-11-14 LAB — CBC WITH DIFFERENTIAL/PLATELET
Basophils Absolute: 0 K/uL (ref 0.0–0.1)
Basophils Relative: 0.8 % (ref 0.0–3.0)
Eosinophils Absolute: 0.1 K/uL (ref 0.0–0.7)
Eosinophils Relative: 1.1 % (ref 0.0–5.0)
HCT: 41.7 % (ref 36.0–46.0)
Hemoglobin: 14.1 g/dL (ref 12.0–15.0)
Lymphocytes Relative: 38.4 % (ref 12.0–46.0)
Lymphs Abs: 2.3 K/uL (ref 0.7–4.0)
MCHC: 33.9 g/dL (ref 30.0–36.0)
MCV: 87.8 fl (ref 78.0–100.0)
Monocytes Absolute: 0.3 K/uL (ref 0.1–1.0)
Monocytes Relative: 4.9 % (ref 3.0–12.0)
Neutro Abs: 3.3 K/uL (ref 1.4–7.7)
Neutrophils Relative %: 54.8 % (ref 43.0–77.0)
Platelets: 333 K/uL (ref 150.0–400.0)
RBC: 4.75 Mil/uL (ref 3.87–5.11)
RDW: 12.9 % (ref 11.5–15.5)
WBC: 6 K/uL (ref 4.0–10.5)

## 2023-11-14 LAB — STRONGYLOIDES, AB, IGG: Strongyloides, Ab, IgG: NEGATIVE

## 2023-11-20 ENCOUNTER — Encounter (INDEPENDENT_AMBULATORY_CARE_PROVIDER_SITE_OTHER): Payer: Self-pay | Admitting: Physician Assistant

## 2023-11-20 ENCOUNTER — Ambulatory Visit (INDEPENDENT_AMBULATORY_CARE_PROVIDER_SITE_OTHER): Admitting: Physician Assistant

## 2023-11-20 ENCOUNTER — Ambulatory Visit (INDEPENDENT_AMBULATORY_CARE_PROVIDER_SITE_OTHER): Admitting: Audiology

## 2023-11-20 ENCOUNTER — Ambulatory Visit
Admission: RE | Admit: 2023-11-20 | Discharge: 2023-11-20 | Disposition: A | Discharge status: Home / Self Care | Source: Ambulatory Visit | Attending: Family | Admitting: Family

## 2023-11-20 VITALS — BP 104/74 | HR 72

## 2023-11-20 DIAGNOSIS — R1011 Right upper quadrant pain: Secondary | ICD-10-CM | POA: Diagnosis not present

## 2023-11-20 DIAGNOSIS — Z0110 Encounter for examination of ears and hearing without abnormal findings: Secondary | ICD-10-CM

## 2023-11-20 DIAGNOSIS — H9209 Otalgia, unspecified ear: Secondary | ICD-10-CM | POA: Diagnosis not present

## 2023-11-20 DIAGNOSIS — H93293 Other abnormal auditory perceptions, bilateral: Secondary | ICD-10-CM

## 2023-11-20 DIAGNOSIS — H9203 Otalgia, bilateral: Secondary | ICD-10-CM

## 2023-11-20 MED ORDER — FLUTICASONE PROPIONATE 50 MCG/ACT NA SUSP
2.0000 | Freq: Every day | NASAL | 6 refills | Status: DC
Start: 2023-11-20 — End: 2024-01-01

## 2023-11-20 NOTE — Progress Notes (Signed)
 Dear Dr. Jesus, Here is my assessment for our mutual patient, Paula Burch. Thank you for allowing me the opportunity to care for your patient. Please do not hesitate to contact me should you have any other questions. Sincerely, Paula Cohen PA-C  Otolaryngology Clinic Note Referring provider: Dr. Jesus HPI:  Paula Burch is a 28 y.o. female kindly referred by Dr. Jesus   The patient is a 28 year old female seen in our office for evaluation of ear pain.  The patient notes approximate 2 years ago she had mastitis, at the same time she also had a left-sided ear infection.  She notes no episodes of recurrent otitis media since that time but continues to endorse pain in bilateral ears.  She notes at times her ears feel warm, she felt like she was having some difficulty hearing out of both ears.  She denies any clicking or popping, no dizziness, no ringing.  She does have a history of traumatic brain injury.  She notes history of recurrent ear infections as a child but does not believe she had tubes.     Independent Review of Additional Tests or Records:  Office visit note on 09/30/2023   PMH/Meds/All/SocHx/FamHx/ROS:   Past Medical History:  Diagnosis Date   Anxiety    Depression    Full-term PROM with onset of labor within 24 hours of rupture 04/11/2021   History of shoulder dystocia in prior pregnancy 08/04/2020   Irritability 07/06/2022   Could be brain injury, genetic or situational.   Marijuana use 08/14/2020   Formatting of this note might be different from the original.  08/11/20 Quit two weeks ago.   Smoker 08/14/2020   Formatting of this note might be different from the original.  08/12/20 Quitting. Down to 5 cig/day from 1/2 PPD.   Supervision of high-risk pregnancy 11/14/2020   Vaginal bleeding in pregnancy 08/14/2020   Formatting of this note might be different from the original.  09/05/20: Could not check progesterone  b/c patient took prog last night  Recheck  progesterone  with 10wk labs  08/11/20 Prog 11.8 @ ~6w, Plan Prog 200mg  PO/PV q HS     LMP 06/29/20; +HPT 08/01/20     Past Surgical History:  Procedure Laterality Date   BACK SURGERY     JOINT REPLACEMENT  11/2014   SPINE SURGERY  11/2014    Family History  Problem Relation Age of Onset   Stroke Mother    Aneurysm Mother    Cancer Maternal Grandmother    Diabetes Paternal Grandmother      Social Connections: Socially Integrated (10/18/2023)   Social Connection and Isolation Panel    Frequency of Communication with Friends and Family: More than three times a week    Frequency of Social Gatherings with Friends and Family: Once a week    Attends Religious Services: More than 4 times per year    Active Member of [employer] or Organizations: Yes    Attends Engineer, structural: More than 4 times per year    Marital Status: Married      Current Outpatient Medications:    amphetamine-dextroamphetamine (ADDERALL XR) 10 MG 24 hr capsule, Take 10 mg by mouth., Disp: , Rfl:    amphetamine-dextroamphetamine (ADDERALL) 10 MG tablet, Take 10 mg by mouth 2 (two) times daily as needed., Disp: , Rfl:    amphetamine-dextroamphetamine (ADDERALL) 20 MG tablet, Take 20 mg by mouth 2 (two) times daily., Disp: , Rfl:    busPIRone (BUSPAR) 5 MG tablet, Take 5  mg by mouth 2 (two) times daily., Disp: , Rfl:    cyclobenzaprine  (FLEXERIL ) 5 MG tablet, Take 1-2 tablets (5-10 mg total) by mouth 3 (three) times daily as needed for muscle spasms (For headache/migraine pain. May cause drowsiness, ok to take 2 pills at bedtime.)., Disp: 30 tablet, Rfl: 0   ibuprofen (ADVIL) 800 MG tablet, Take 800 mg by mouth every 6 (six) hours as needed., Disp: , Rfl:    Multiple Vitamins-Minerals (MULTIVITAMIN WITH MINERALS) tablet, Take 1 tablet by mouth daily., Disp: 90 tablet, Rfl: 3   Omega-3 Fatty Acids (FISH OIL ) 1000 MG CAPS, Take 2 capsules (2,000 mg total) by mouth 2 (two) times daily., Disp: 90 capsule, Rfl: 3    VITAMIN D  PO, Take by mouth daily., Disp: , Rfl:    Physical Exam:   BP 104/74   Pulse 72   LMP 09/28/2023 (Exact Date)   SpO2 97%   Pertinent Findings  CN II-XII intact Left EAC clear, minimal sclerosis on the left TM, well pneumatized middle ear space, right EAC clear, no significant sclerosis, well-pneumatized middle ear space Anterior rhinoscopy: Septum midline; bilateral inferior turbinates with minimal No lesions of oral cavity/oropharynx; dentition within normal limits No obviously palpable neck masses/lymphadenopathy/thyromegaly No respiratory distress or stridor   Seprately Identifiable Procedures:  Audiological evaluation on 11/20/2023 Normal audiological evaluation   Impression & Plans:  Aaralynn Shepheard is a 28 y.o. female with the following   Ear pain-  Unclear etiology of her ongoing ear pain.  She does have somewhat persistent symptoms, she does not have a clear etiology of eustachian tube dysfunction, no signs of infection.  She does report a history of traumatic brain injury although lower suspicion that this is secondary to that.  I do think it is reasonable to try antihistamines and Flonase  with nasal saline irrigation to see if this relieves any of her symptoms.  If she develops any new or worsening signs or symptoms I like see her back in the office.   - f/u PRN   Thank you for allowing me the opportunity to care for your patient. Please do not hesitate to contact me should you have any other questions.  Sincerely, Paula Cohen PA-C [facility] Phone: [phone] Fax: [phone]  11/20/2023, 9:14 AM
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
## 2023-11-22 ENCOUNTER — Encounter: Payer: Self-pay | Admitting: Internal Medicine

## 2023-11-22 ENCOUNTER — Ambulatory Visit: Admitting: Internal Medicine

## 2023-11-22 ENCOUNTER — Telehealth (INDEPENDENT_AMBULATORY_CARE_PROVIDER_SITE_OTHER): Admitting: Internal Medicine

## 2023-11-22 DIAGNOSIS — R1011 Right upper quadrant pain: Secondary | ICD-10-CM | POA: Diagnosis not present

## 2023-11-22 DIAGNOSIS — G43809 Other migraine, not intractable, without status migrainosus: Secondary | ICD-10-CM

## 2023-11-22 DIAGNOSIS — G43819 Other migraine, intractable, without status migrainosus: Secondary | ICD-10-CM

## 2023-11-22 MED ORDER — NURTEC 75 MG PO TBDP: 1.0000 | ORAL_TABLET | Freq: Every day | ORAL | 2 refills | Status: DC

## 2023-11-24 ENCOUNTER — Encounter: Payer: Self-pay | Admitting: Internal Medicine

## 2023-11-24 NOTE — Patient Instructions (Signed)
 It was a pleasure seeing you today! Your health and satisfaction are our top priorities.  Bernardino Cone, MD  VISIT SUMMARY: Today, you were seen for right upper quadrant pain and recent migraine history. Your right upper quadrant pain is likely musculoskeletal, and your recent 13-day migraine has resolved. We discussed potential triggers and management strategies for both issues.  YOUR PLAN: -RIGHT UPPER QUADRANT ABDOMINAL PAIN: Your right upper quadrant pain is likely due to a muscle strain in your abdominal wall or ribcage. This type of pain is usually not serious and should improve with time. We will monitor for any signs of infection, such as a high fever, which would require immediate attention. For now, try not to stress about the pain unless it worsens.  -MIGRAINE: A migraine is a type of headache that can cause severe pain and other symptoms. Your recent 13-day migraine has resolved, but we have prescribed Nurtec to manage any future episodes. Take Nurtec as needed or every other day if symptoms persist. It's important to stay hydrated and limit caffeine  intake to help prevent migraines. You should follow up with your neurologist at the end of September, and you are on the cancellation list for an earlier appointment.  INSTRUCTIONS: Please monitor your symptoms and seek immediate medical attention if you develop a high fever. Take Nurtec as prescribed for migraine management. Follow up with your neurologist at the end of September, and you are on the cancellation list for an earlier appointment.  Your Providers PCP: Cone Bernardino MATSU, MD,  [phone]) Referring Provider: Cone Bernardino MATSU, MD,  [phone]) Care Team Provider: Skeet Juliene SAUNDERS, DO,  [phone])  NEXT STEPS: [x]  Early Intervention: Schedule sooner appointment, call our on-call services, or go to emergency room if there is any significant Increase in pain or discomfort New or worsening symptoms Sudden or severe changes  in your health [x]  Flexible Follow-Up: We recommend a No follow-ups on file. for optimal routine care. This allows for progress monitoring and treatment adjustments. [x]  Preventive Care: Schedule your annual preventive care visit! It's typically covered by insurance and helps identify potential health issues early. [x]  Lab & X-ray Appointments: Incomplete tests scheduled today, or call to schedule. X-rays: [facility] (M-F, 8:30am-noon or 1pm-5pm). [x]  Medical Information Release: Sign a release form at front desk to obtain relevant medical information we don't have.  MAKING THE MOST OF OUR FOCUSED 20 MINUTE APPOINTMENTS: [x]   Clearly state your top concerns at the beginning of the visit to focus our discussion [x]   If you anticipate you will need more time, please inform the front desk during scheduling - we can book multiple appointments in the same week. [x]   If you have transportation problems- use our convenient video appointments or ask about transportation support. [x]   We can get down to business faster if you use MyChart to update information before the visit and submit non-urgent questions before your visit. Thank you for taking the time to provide details through MyChart.  Let our nurse know and she can import this information into your encounter documents.  Arrival and Wait Times: [x]   Arriving on time ensures that everyone receives prompt attention. [x]   Early morning (8a) and afternoon (1p) appointments tend to have shortest wait times. [x]   Unfortunately, we cannot delay appointments for late arrivals or hold slots during phone calls.  Getting Answers and Following Up [x]   Simple Questions & Concerns: For quick questions or basic follow-up after your visit, reach us  at [phone] or MyChart messaging. [x]   Complex Concerns: If your concern is more complex, scheduling an appointment might be best. Discuss this with the staff to find the most suitable option. [x]    Lab & Imaging Results: We'll contact you directly if results are abnormal or you don't use MyChart. Most normal results will be on MyChart within 2-3 business days, with a review message from Dr. Jesus. Haven't heard back in 2 weeks? Need results sooner? Contact us  at [phone]. [x]   Referrals: Our referral coordinator will manage specialist referrals. The specialist's office should contact you within 2 weeks to schedule an appointment. Call us  if you haven't heard from them after 2 weeks.  Staying Connected [x]   MyChart: Activate your MyChart for the fastest way to access results and message us . See the last page of this paperwork for instructions on how to activate.  Bring to Your Next Appointment [x]   Medications: Please bring all your medication bottles to your next appointment to ensure we have an accurate record of your prescriptions. [x]   Health Diaries: If you're monitoring any health conditions at home, keeping a diary of your readings can be very helpful for discussions at your next appointment.  Billing [x]   X-ray & Lab Orders: These are billed by separate companies. Contact the invoicing company directly for questions or concerns. [x]   Visit Charges: Discuss any billing inquiries with our administrative services team.  Your Satisfaction Matters [x]   Share Your Experience: We strive for your satisfaction! If you have any complaints, or preferably compliments, please let Dr. Jesus know directly or contact our Practice Administrators, Manuelita Rubin or [employer], by asking at the front desk.   Reviewing Your Records [x]   Review this early draft of your clinical encounter notes below and the final encounter summary tomorrow on MyChart after its been completed.  All orders placed so far are visible here: Other migraine without status migrainosus, not intractable -     Nurtec; Take 1 tablet (75 mg total) by mouth daily at 6 (six) AM.  Dispense: 15 tablet; Refill: 2  RUQ  pain

## 2023-11-24 NOTE — Progress Notes (Signed)
 ====================================   [facility]: [phone]   --  Virtual Video Medical Office Visit --  Patient: Paula Burch      Age: 28 y.o.       Sex:  female  Date:   11/22/2023 Today's Healthcare Provider: Bernardino KANDICE Cone, MD  ====================================    Chief Complaint/Reason For Visit: Discuss lab results   History of Present Illness 28 year old female who presents with right upper quadrant pain and recent migraine history.  She experiences intermittent right upper quadrant pain, described as annoying rather than severe, with occasional sharp, fleeting sensations. The pain does not correlate with eating or drinking but feels tighter when stretching or lifting heavy objects. An ultrasound showed no gallstones. She suspects a past use of ivermectin  might be related.  She had a migraine lasting thirteen days, which resolved the day before the visit. She has been headache-free for approximately 36 hours but feels it might be returning. She has not taken any prescribed medication for the migraine, despite being prescribed Flexeril , which she did not use. A neurologist prescribed an antidepressant for migraine management, which she declined. She attributes her migraines to stress and loud noises, particularly at work.  Her husband, [name], is experiencing significant stress and anxiety, which she finds challenging to manage. She notes that stress and anxiety are potential triggers for her symptoms. She has been mindful of her hydration status, noting that she may have consumed too many electrolytes in an effort to stay hydrated due to her coffee consumption.   Medications reviewed Current Outpatient Medications on File Prior to Visit  Medication Sig   amphetamine-dextroamphetamine (ADDERALL XR) 10 MG 24 hr capsule Take 10 mg by mouth.   amphetamine-dextroamphetamine (ADDERALL) 10 MG tablet Take 10 mg by mouth 2 (two) times  daily as needed.   amphetamine-dextroamphetamine (ADDERALL) 20 MG tablet Take 20 mg by mouth 2 (two) times daily.   busPIRone (BUSPAR) 5 MG tablet Take 5 mg by mouth 2 (two) times daily.   cyclobenzaprine  (FLEXERIL ) 5 MG tablet Take 1-2 tablets (5-10 mg total) by mouth 3 (three) times daily as needed for muscle spasms (For headache/migraine pain. May cause drowsiness, ok to take 2 pills at bedtime.).   fluticasone  (FLONASE ) 50 MCG/ACT nasal spray Place 2 sprays into both nostrils daily.   ibuprofen (ADVIL) 800 MG tablet Take 800 mg by mouth every 6 (six) hours as needed.   Multiple Vitamins-Minerals (MULTIVITAMIN WITH MINERALS) tablet Take 1 tablet by mouth daily.   Omega-3 Fatty Acids (FISH OIL ) 1000 MG CAPS Take 2 capsules (2,000 mg total) by mouth 2 (two) times daily.   VITAMIN D  PO Take by mouth daily.   No current facility-administered medications on file prior to visit.  There are no discontinued medications.     General Appearance:  Well Developed, Well Nourished, No Acute Distress by Limited Video Assessment Pulmonary:  No Respiratory Distress Apparent. Normal Work of Breathing.   Neurological:  Awake, Alert. No Obvious Focal Neurological Deficits or Cognitive Impairments.  Sensorium Seems Unclouded. Psychiatric:  Appropriate Mood, Pleasant Demeanor, Calm, Articulate, Good Mood        No results found for any visits on 11/22/23. Office Visit on 11/13/2023  Component Date Value   Strongyloides, Ab, IgG 11/13/2023 Negative    WBC 11/13/2023 6.0    RBC 11/13/2023 4.75    Hemoglobin 11/13/2023 14.1    HCT 11/13/2023 41.7    MCV 11/13/2023 87.8    MCHC  11/13/2023 33.9    RDW 11/13/2023 12.9    Platelets 11/13/2023 333.0    Neutrophils Relative % 11/13/2023 54.8    Lymphocytes Relative 11/13/2023 38.4    Monocytes Relative 11/13/2023 4.9    Eosinophils Relative 11/13/2023 1.1    Basophils Relative 11/13/2023 0.8    Neutro Abs 11/13/2023 3.3    Lymphs Abs 11/13/2023 2.3     Monocytes Absolute 11/13/2023 0.3    Eosinophils Absolute 11/13/2023 0.1    Basophils Absolute 11/13/2023 0.0   Scanned Document on 10/10/2023  Component Date Value   TSH 09/30/2023 3.00   Office Visit on 09/30/2023  Component Date Value   Strongyloides, Ab, IgG 09/30/2023 Negative    MICRO NUMBER: 10/02/2023 [id number]    SPECIMEN QUALITY: 10/02/2023 Adequate    Source 10/02/2023 STOOL    STATUS: 10/02/2023 FINAL    CONCENTRATE RESULT: 10/02/2023 No ova or parasites seen    TRICHROME RESULT: 10/02/2023 No ova or parasites seen    COMMENT: 10/02/2023                     Value:Routine Ova and Parasite exam may not detect some parasites that occasionally cause diarrheal illness. Cryptosporidium Antigen and/or Cyclospora and Isospora Exam may be ordered to detect these parasites. One negative sample does not necessarily rule out  the presence of a parasitic infection.  For additional information, please refer to https://education.quest[employer].com/faq/FAQ203 (This link is being provided for informational/ educational purposes only.)    Sodium 09/30/2023 139    Potassium 09/30/2023 4.0    Chloride 09/30/2023 101    CO2 09/30/2023 30    Glucose, Bld 09/30/2023 90    BUN 09/30/2023 12    Creatinine, Ser 09/30/2023 0.82    Total Bilirubin 09/30/2023 0.6    Alkaline Phosphatase 09/30/2023 38 (L)    AST 09/30/2023 14    ALT 09/30/2023 12    Total Protein 09/30/2023 7.5    Albumin 09/30/2023 4.8    GFR 09/30/2023 96.12    Calcium 09/30/2023 9.7    TSH 09/30/2023 2.86    TSH 09/30/2023 3.0    T4 09/30/2023 7.4    Free T-3 09/30/2023 3.3    Triiodothyronine (T-3), * 09/30/2023 104    Testosterone , Serum (Tot* 09/30/2023 17    Free Testosterone , Serum 09/30/2023 1.7    Testosterone -% Free 09/30/2023 1.0    Follicle Stimulating Hor* 09/30/2023 11    Progesterone , Serum 09/30/2023 <20    DHEA-Sulfate, LCMS 09/30/2023 140    Sex Hormone Binding Glob* 09/30/2023 85.5    Estrone  Sulfate  09/30/2023 <20    Estradiol , Serum, MS 09/30/2023 33    Homocysteine 09/30/2023 8.6    Vitamin B-12 09/30/2023 480    Folate 09/30/2023 15.8   Appointment on 03/18/2023  Component Date Value   TSH W/REFLEX TO FT4 03/18/2023 1.52    Cholesterol 03/18/2023 158    HDL 03/18/2023 53    Triglycerides 03/18/2023 66    LDL Cholesterol (Calc) 03/18/2023 90    Total CHOL/HDL Ratio 03/18/2023 3.0    Non-HDL Cholesterol (Cal* 03/18/2023 105    Cholesterol 03/18/2023 150    Triglycerides 03/18/2023 64.0    HDL 03/18/2023 42.20    VLDL 03/18/2023 12.8    LDL Cholesterol 03/18/2023 95    Total CHOL/HDL Ratio 03/18/2023 4    NonHDL 03/18/2023 107.81    Iron 03/18/2023 57    TIBC 03/18/2023 319    %SAT 03/18/2023 18    Ferritin 03/18/2023 33  Cortisol, Plasma 03/18/2023 8.4    Sodium 03/18/2023 141    Potassium 03/18/2023 4.0    Chloride 03/18/2023 105    CO2 03/18/2023 26    Glucose, Bld 03/18/2023 90    BUN 03/18/2023 14    Creatinine, Ser 03/18/2023 0.86    Total Bilirubin 03/18/2023 0.6    Alkaline Phosphatase 03/18/2023 45    AST 03/18/2023 11    ALT 03/18/2023 11    Total Protein 03/18/2023 6.7    Albumin 03/18/2023 4.6    GFR 03/18/2023 91.12    Calcium 03/18/2023 9.7    WBC 03/18/2023 5.9    RBC 03/18/2023 4.61    Hemoglobin 03/18/2023 14.0    HCT 03/18/2023 41.2    MCV 03/18/2023 89.3    MCHC 03/18/2023 33.9    RDW 03/18/2023 13.2    Platelets 03/18/2023 344.0    Neutrophils Relative % 03/18/2023 57.9    Lymphocytes Relative 03/18/2023 34.1    Monocytes Relative 03/18/2023 4.6    Eosinophils Relative 03/18/2023 3.0    Basophils Relative 03/18/2023 0.4    Neutro Abs 03/18/2023 3.4    Lymphs Abs 03/18/2023 2.0    Monocytes Absolute 03/18/2023 0.3    Eosinophils Absolute 03/18/2023 0.2    Basophils Absolute 03/18/2023 0.0    CRP 03/18/2023 <1.0    Albumin 03/18/2023 4.6    Dehydroepiandrosterone 03/18/2023 339    LH 03/18/2023 5.2    FSH 03/18/2023 6.5     Prolactin 03/18/2023 4.7 (L)    Progesterone  03/18/2023 0.4    Estrogen 03/18/2023 57    Sex Hormone Binding 03/18/2023 68.9    Vit D, 25-Hydroxy 03/18/2023 48.6   No image results found. US  Abdomen Limited RUQ (LIVER/GB) Result Date: 11/22/2023 CLINICAL DATA:  RUQ pain EXAM: ULTRASOUND ABDOMEN LIMITED RIGHT UPPER QUADRANT COMPARISON:  None Available. FINDINGS: Gallbladder: No gallstones or wall thickening visualized. No sonographic Murphy sign noted by sonographer. Common bile duct: Diameter: Visualized portion measures 2 mm, within normal limits. Liver: No focal lesion identified. Within normal limits in parenchymal echogenicity. Portal vein is patent on color Doppler imaging with normal direction of blood flow towards the liver. Other: None. IMPRESSION: No sonographic etiology for right upper quadrant pain identified. Electronically Signed   By: Corean Salter M.D.   On: 11/22/2023 13:55   MR Brain W Wo Contrast Result Date: 09/29/2023 EXAMINATION: MR BRAIN W WO CONTRAST HISTORY: Traumatic brain injury (TBI), new or progressive neuro deficits; Mental status change, unknown cause; Headache, neuro deficit; worsening cognitive status, brain fog, history of tbi, severeheadache TECHNIQUE: MRI of the brain performed with and without IV contrast. CONTRAST: 6 cc Vueway  IV COMPARISON: None FINDINGS: No evidence for intracranial mass, hemorrhage, or acute infarct. 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 There is encephalomalacia in the anterior left frontal lobe as well as the bilateral orbital frontal lobes, anterior bilateral temporal lobes, and posterior left occipital lobe. The ventricles are symmetric and the basilar cisterns are patent. The paranasal sinuses and mastoid air cells are well aerated. The orbits appear normal. No abnormal enhancement is appreciated following the administration of intravenous contrast material. 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 Electronically signed by: Italy Engel MD 09/29/2023 04:33 PM EDT RP Workstation: [id number]  US  Abdomen Limited RUQ (LIVER/GB) Result Date: 11/22/2023 CLINICAL DATA:  RUQ pain EXAM: ULTRASOUND ABDOMEN LIMITED RIGHT UPPER QUADRANT COMPARISON:  None Available. FINDINGS: Gallbladder: No gallstones or wall thickening visualized.  No sonographic Murphy sign noted by sonographer. Common bile duct: Diameter: Visualized portion measures 2 mm, within normal limits. Liver: No focal lesion identified. Within normal limits in parenchymal echogenicity. Portal vein is patent on color Doppler imaging with normal direction of blood flow towards the liver. Other: None. IMPRESSION: No sonographic etiology for right upper quadrant pain identified. Electronically Signed   By: Corean Salter M.D.   On: 11/22/2023 13:55       Assessment & Plan Other migraine without status migrainosus, not intractable Migraine, unspecified, not intractable, without status migrainosus   She recently experienced a 13-day migraine episode, now resolved, possibly linked to traumatic brain injury and stress. No current red flags warrant further imaging. Abdominal migraine is considered as a differential diagnosis for her abdominal pain. Nurtec is prescribed for migraine management, to be taken as needed or every other day if symptoms persist. Advise on hydration and limiting caffeine  intake to prevent migraines. Follow up with a neurologist at the end of September, with her on the cancellation list for an earlier appointment. RUQ pain Right upper quadrant abdominal pain, likely musculoskeletal   She experiences intermittent right upper quadrant abdominal pain, described as annoying but not severe. An ultrasound showed no gallstones or significant liver issues. The pain is likely musculoskeletal, possibly due to a strain of the abdominal wall or ribcage muscle.  Ivermectin -related pain is less likely as symptoms have not significantly improved since discontinuation. There is no evidence of significant liver or gallbladder pathology. Abdominal migraine is included in the differential diagnosis. Monitor for high fever, which may indicate gallbladder infection and necessitate gallbladder removal. Reassure her and advise not to stress about the pain unless symptoms worsen.      Orders Placed During this Encounter:  No orders of the defined types were placed in this encounter.  Meds ordered this encounter  Medications   Rimegepant Sulfate (NURTEC) 75 MG TBDP    Sig: Take 1 tablet (75 mg total) by mouth daily at 6 (six) AM.    Dispense:  15 tablet    Refill:  2    Treatment plan discussed and reviewed in detail. Explained medication safety and potential side effects.  Answered all patient questions and confirmed understanding and comfort with the plan. Encouraged patient to contact our office if they have any questions or concerns.  Agreed on patient coming for a sooner office visit if symptoms worsen, persist, or new symptoms develop. Discussed precautions in case of needing to visit the Emergency Department.    ----------------------------------------------------- Attestation:  Today's Healthcare Provider Bernardino KANDICE Cone, MD was located at office at [facility] at [facility] [address] [place] [address].  The patient was located at car passenger. All video encounter participant identities and locations confirmed visually and verbally.Today's Telemedicine visit was conducted via synchronous Video after consent for telemedicine was obtained:  Video connection was never lost    This document was transcribed and resynthesized, in part, by artificial intelligence (Abridge) using HIPAA-compliant recording of the clinical interaction;   We have discussed the our use of AI scribe software for clinical note transcription with the patient,  who has given verbal consent to proceed.

## 2023-11-25 ENCOUNTER — Ambulatory Visit: Payer: Self-pay | Admitting: Family

## 2023-11-25 ENCOUNTER — Ambulatory Visit: Admitting: Internal Medicine

## 2023-11-25 NOTE — Progress Notes (Signed)
 Good news, ultrasound normal of her liver and gall bladder.

## 2023-11-26 ENCOUNTER — Telehealth: Payer: Self-pay

## 2023-11-26 DIAGNOSIS — G43E11 Chronic migraine with aura, intractable, with status migrainosus: Secondary | ICD-10-CM

## 2023-11-26 MED ORDER — BUTALBITAL-APAP-CAFFEINE 50-325-40 MG PO TABS: 1.0000 | ORAL_TABLET | Freq: Four times a day (QID) | ORAL | 0 refills | Status: AC | PRN

## 2023-11-26 NOTE — Telephone Encounter (Signed)
 Pharmacy Patient Advocate Encounter   Received notification from Onbase that prior authorization for Nurtec 75MG  dispersible tablets is required/requested.   Insurance verification completed.   The patient is insured through [facility] .   Per test claim: PA required; PA submitted to above mentioned insurance via CoverMyMeds Key/confirmation #/EOC [id number] Status is pending

## 2023-11-27 MED ORDER — RIZATRIPTAN BENZOATE 10 MG PO TABS: 10.0000 mg | ORAL_TABLET | ORAL | 0 refills | Status: DC | PRN

## 2023-11-27 NOTE — Addendum Note (Signed)
 Addended by: Keyshun Elpers G on: 11/27/2023 08:08 PM   Modules accepted: Orders

## 2023-11-27 NOTE — Telephone Encounter (Signed)
 Pharmacy Patient Advocate Encounter  Received notification from [employer] that Prior Authorization for  Nurtec 75MG  dispersible tablets  has been DENIED.  Full denial letter will be uploaded to the media tab. See denial reason below.   PA #/Case ID/Reference #: [id number]

## 2023-12-01 DIAGNOSIS — R0789 Other chest pain: Secondary | ICD-10-CM | POA: Diagnosis not present

## 2023-12-01 DIAGNOSIS — R079 Chest pain, unspecified: Secondary | ICD-10-CM | POA: Diagnosis not present

## 2023-12-01 DIAGNOSIS — R21 Rash and other nonspecific skin eruption: Secondary | ICD-10-CM | POA: Diagnosis not present

## 2023-12-03 DIAGNOSIS — Z6820 Body mass index (BMI) 20.0-20.9, adult: Secondary | ICD-10-CM | POA: Diagnosis not present

## 2023-12-03 DIAGNOSIS — R21 Rash and other nonspecific skin eruption: Secondary | ICD-10-CM | POA: Diagnosis not present

## 2023-12-07 DIAGNOSIS — F4312 Post-traumatic stress disorder, chronic: Secondary | ICD-10-CM | POA: Diagnosis not present

## 2023-12-07 DIAGNOSIS — F900 Attention-deficit hyperactivity disorder, predominantly inattentive type: Secondary | ICD-10-CM | POA: Diagnosis not present

## 2023-12-07 DIAGNOSIS — F331 Major depressive disorder, recurrent, moderate: Secondary | ICD-10-CM | POA: Diagnosis not present

## 2023-12-10 MED ORDER — RIZATRIPTAN BENZOATE 10 MG PO TABS: 10.0000 mg | ORAL_TABLET | ORAL | 0 refills | Status: DC | PRN

## 2023-12-10 NOTE — Addendum Note (Signed)
 Addended by: Eathel Pajak G on: 12/10/2023 03:23 PM   Modules accepted: Orders

## 2023-12-15 MED ORDER — RIZATRIPTAN BENZOATE 10 MG PO TABS: 10.0000 mg | ORAL_TABLET | ORAL | 11 refills | Status: DC | PRN

## 2023-12-15 NOTE — Addendum Note (Signed)
 Addended by: Oakley Kossman G on: 12/15/2023 01:44 PM   Modules accepted: Orders

## 2023-12-16 ENCOUNTER — Ambulatory Visit (HOSPITAL_COMMUNITY): Admitting: Mental Health

## 2023-12-16 ENCOUNTER — Encounter (HOSPITAL_COMMUNITY): Payer: Self-pay

## 2023-12-28 DIAGNOSIS — F331 Major depressive disorder, recurrent, moderate: Secondary | ICD-10-CM | POA: Diagnosis not present

## 2023-12-28 DIAGNOSIS — F900 Attention-deficit hyperactivity disorder, predominantly inattentive type: Secondary | ICD-10-CM | POA: Diagnosis not present

## 2023-12-28 DIAGNOSIS — F4312 Post-traumatic stress disorder, chronic: Secondary | ICD-10-CM | POA: Diagnosis not present

## 2024-01-01 ENCOUNTER — Encounter: Payer: Self-pay | Admitting: Neurology

## 2024-01-01 ENCOUNTER — Ambulatory Visit: Admitting: Neurology

## 2024-01-01 VITALS — BP 104/60 | HR 84 | Ht 67.0 in | Wt 130.0 lb

## 2024-01-01 DIAGNOSIS — R569 Unspecified convulsions: Secondary | ICD-10-CM

## 2024-01-01 DIAGNOSIS — G43E09 Chronic migraine with aura, not intractable, without status migrainosus: Secondary | ICD-10-CM

## 2024-01-01 DIAGNOSIS — Z87820 Personal history of traumatic brain injury: Secondary | ICD-10-CM

## 2024-01-01 DIAGNOSIS — F419 Anxiety disorder, unspecified: Secondary | ICD-10-CM

## 2024-01-01 MED ORDER — RIZATRIPTAN BENZOATE 10 MG PO TABS: 10.0000 mg | ORAL_TABLET | ORAL | 11 refills | Status: AC | PRN

## 2024-01-01 MED ORDER — PROPRANOLOL HCL ER 60 MG PO CP24: 60.0000 mg | ORAL_CAPSULE | Freq: Every day | ORAL | 6 refills | Status: AC
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
## 2024-01-01 NOTE — Patient Instructions (Addendum)
 Routine EEG, if abnormal will likely start patient on antiseizure medication Please contact me if you do have another event  Start propranolol  extended release 60 mg nightly as migraine prevention and this may also help with anxiety symptoms Rizatriptan  as needed for abortive medication for migraine Please contact us  if headaches frequency worsen Continue your other medications Continue follow-up PCP Return in 6 months or sooner

## 2024-01-18 DIAGNOSIS — F900 Attention-deficit hyperactivity disorder, predominantly inattentive type: Secondary | ICD-10-CM | POA: Diagnosis not present

## 2024-01-18 DIAGNOSIS — F331 Major depressive disorder, recurrent, moderate: Secondary | ICD-10-CM | POA: Diagnosis not present

## 2024-01-18 DIAGNOSIS — F4312 Post-traumatic stress disorder, chronic: Secondary | ICD-10-CM | POA: Diagnosis not present

## 2024-01-20 ENCOUNTER — Ambulatory Visit: Admitting: Neurology

## 2024-01-21 ENCOUNTER — Encounter: Payer: Self-pay | Admitting: Internal Medicine

## 2024-01-21 ENCOUNTER — Ambulatory Visit: Payer: Self-pay

## 2024-01-21 NOTE — Telephone Encounter (Signed)
 FYI Only or Action Required?: Action required by provider: request for appointment and clinical question for provider.  Patient was last seen in primary care on 11/22/2023 by Jesus Bernardino MATSU, MD.  Called Nurse Triage reporting Facial Swelling.  Symptoms began several days ago.  Interventions attempted: OTC medications: acne facial wash and Ice/heat application.  Symptoms are: unchanged.  Triage Disposition: See PCP When Office is Open (Within 3 Days)  Patient/caregiver understands and will follow disposition?: Yes  Copied from CRM #[id number]. Topic: Clinical - Red Word Triage >> Jan 21, 2024 12:55 PM [place] J wrote: [facility] that prompted transfer to Nurse Triage: Patient has a cyst on her left upper cheek area. This is causing right eye swelling. Reason for Disposition  [1] MILD face swelling (e.g., puffiness) AND [2] persists > 3 days  Answer Assessment - Initial Assessment Questions No available appts today in person or video visit. Patient requesting video visit today or tomorrow, after 3 PM; pt at work, had to end call. CaLL BACK  Advised UC. Pt reports will go if unable to get vv. 1. ONSET: When did the swelling start? (e.g., minutes, hours, days)     Wednesday 2. LOCATION: What part of the face is swollen? (e.g., cheek, entire face, jaw joint area, under jaw)     By nose, by check bone, under eye; had pimple knot there 3. SEVERITY: How swollen is it?     Causing lower eyelid swollen, entire right side of face swollen 4. ITCHING: Is there any itching? If Yes, ask: How much?   (Scale 1-10; mild, moderate or severe)     denies 5. PAIN: Is the swelling painful to touch? If Yes, ask: How painful is it?   (Scale 0-10; mild, moderate or severe)     0/10;mild burning 6. FEVER: Do you have a fever? If Yes, ask: What is it, how was it measured, and when did it start?      Denies fever, chills, n/v, diff swallowing, sob 7. CAUSE: What do you think is causing the face  swelling?     Parasitic exposure 8. NEW MEDICINES: Have there been any new medicines started recently?     No; not injuries; tired acne facial wash and heat compresses 9. RECURRENT SYMPTOM: Have you had face swelling before? If Yes, ask: When was the last time? What happened that time?     Pimple before, went away on it's on, 2 weeks ago. Few months ago did parastatic treatment, not sure if reinfected 10. OTHER SYMPTOMS: Do you have any other symptoms? (e.g., leg swelling, toothache)       denies Husband treated for parasitic; pimple came back  Protocols used: Face Swelling-A-AH

## 2024-01-21 NOTE — Telephone Encounter (Signed)
 Sent a message to pt via my chart if not able to get in with provider today to go to urgent care If gets worse

## 2024-01-27 ENCOUNTER — Other Ambulatory Visit: Admitting: *Deleted

## 2024-01-27 ENCOUNTER — Encounter: Payer: Self-pay | Admitting: Neurology

## 2024-01-27 NOTE — Telephone Encounter (Signed)
 Pt's sister reports pt has pnemonia, she has r/s for pt

## 2024-02-04 ENCOUNTER — Ambulatory Visit: Admitting: Neurology

## 2024-02-11 ENCOUNTER — Ambulatory Visit: Admitting: Neurology

## 2024-02-11 DIAGNOSIS — Z87820 Personal history of traumatic brain injury: Secondary | ICD-10-CM

## 2024-02-11 DIAGNOSIS — R569 Unspecified convulsions: Secondary | ICD-10-CM

## 2024-02-12 NOTE — Procedures (Signed)
   History:  28 year old woman with seizure   EEG classification:  Awake and asleep  Duration: 26 minutes  Technical aspects: This EEG study was done with scalp electrodes positioned according to the 10-20 International system of electrode placement. Electrical activity was reviewed with band pass filter of 1-70Hz , sensitivity of 7 uV/mm, display speed of 45mm/sec with a 60Hz  notched filter applied as appropriate. EEG data were recorded continuously and digitally stored.   Description of the recording: The background rhythms of this recording consists of a fairly well modulated medium amplitude background activity of 12 Hz. As the record progresses, the patient initially is in the waking state, but appears to enter the early stage II sleep during the recording, with rudimentary sleep spindles and vertex sharp wave activity seen. During the wakeful state, photic stimulation was performed, and no abnormal responses were seen. Hyperventilation was also performed, no abnormal response seen. No epileptiform discharges seen during this recording. There was no focal slowing. At time, there was excess beta.   Abnormality: None   Impression: This is a normal awake and sleep EEG. No evidence of interictal epileptiform discharges. The excess beta activity is nonspecific and likely related to medication. Normal EEGs, however, do not rule out epilepsy.    Chalsey Leeth, MD [facility]

## 2024-02-13 ENCOUNTER — Ambulatory Visit: Payer: Self-pay | Admitting: Neurology

## 2024-02-14 DIAGNOSIS — F331 Major depressive disorder, recurrent, moderate: Secondary | ICD-10-CM | POA: Diagnosis not present

## 2024-02-14 DIAGNOSIS — F900 Attention-deficit hyperactivity disorder, predominantly inattentive type: Secondary | ICD-10-CM | POA: Diagnosis not present

## 2024-02-14 DIAGNOSIS — F4312 Post-traumatic stress disorder, chronic: Secondary | ICD-10-CM | POA: Diagnosis not present

## 2024-03-04 DIAGNOSIS — F900 Attention-deficit hyperactivity disorder, predominantly inattentive type: Secondary | ICD-10-CM | POA: Diagnosis not present

## 2024-03-04 DIAGNOSIS — F331 Major depressive disorder, recurrent, moderate: Secondary | ICD-10-CM | POA: Diagnosis not present

## 2024-03-04 DIAGNOSIS — F4312 Post-traumatic stress disorder, chronic: Secondary | ICD-10-CM | POA: Diagnosis not present

## 2024-03-16 ENCOUNTER — Encounter: Payer: Self-pay | Admitting: Internal Medicine

## 2024-03-16 ENCOUNTER — Ambulatory Visit: Admitting: Internal Medicine

## 2024-04-07 DIAGNOSIS — F4312 Post-traumatic stress disorder, chronic: Secondary | ICD-10-CM | POA: Diagnosis not present

## 2024-04-07 DIAGNOSIS — F900 Attention-deficit hyperactivity disorder, predominantly inattentive type: Secondary | ICD-10-CM | POA: Diagnosis not present

## 2024-05-25 ENCOUNTER — Other Ambulatory Visit: Payer: Self-pay | Admitting: Medical Genetics
